# Patient Record
Sex: Male | Born: 1960
Health system: Southern US, Community
[De-identification: ages and names within clinical notes are randomized; demographics above are authoritative.]

## PROBLEM LIST (undated history)

## (undated) DIAGNOSIS — E785 Hyperlipidemia, unspecified: Secondary | ICD-10-CM

## (undated) DIAGNOSIS — F419 Anxiety disorder, unspecified: Secondary | ICD-10-CM

## (undated) DIAGNOSIS — G4733 Obstructive sleep apnea (adult) (pediatric): Secondary | ICD-10-CM

## (undated) DIAGNOSIS — E669 Obesity, unspecified: Secondary | ICD-10-CM

## (undated) HISTORY — DX: Obstructive sleep apnea (adult) (pediatric): G47.33

## (undated) HISTORY — DX: Hyperlipidemia, unspecified: E78.5

## (undated) HISTORY — DX: Anxiety disorder, unspecified: F41.9

## (undated) HISTORY — DX: Obesity, unspecified: E66.9

---

## 2001-06-20 ENCOUNTER — Encounter: Payer: Self-pay | Admitting: Pulmonary Disease

## 2001-07-19 DIAGNOSIS — G4733 Obstructive sleep apnea (adult) (pediatric): Secondary | ICD-10-CM

## 2001-07-19 HISTORY — DX: Obstructive sleep apnea (adult) (pediatric): G47.33

## 2001-07-20 ENCOUNTER — Ambulatory Visit (HOSPITAL_BASED_OUTPATIENT_CLINIC_OR_DEPARTMENT_OTHER): Admission: RE | Admit: 2001-07-20 | Discharge: 2001-07-20 | Payer: Self-pay | Admitting: Pulmonary Disease

## 2001-07-20 ENCOUNTER — Encounter: Payer: Self-pay | Admitting: Pulmonary Disease

## 2001-08-02 ENCOUNTER — Encounter: Payer: Self-pay | Admitting: Pulmonary Disease

## 2001-11-13 ENCOUNTER — Encounter: Payer: Self-pay | Admitting: Pulmonary Disease

## 2004-12-18 ENCOUNTER — Ambulatory Visit: Payer: Self-pay | Admitting: Pulmonary Disease

## 2005-07-23 ENCOUNTER — Emergency Department (HOSPITAL_COMMUNITY): Admission: EM | Admit: 2005-07-23 | Discharge: 2005-07-23 | Payer: Self-pay | Admitting: Emergency Medicine

## 2005-09-09 ENCOUNTER — Ambulatory Visit: Payer: Self-pay | Admitting: Cardiology

## 2005-09-14 ENCOUNTER — Ambulatory Visit: Payer: Self-pay

## 2009-09-01 ENCOUNTER — Encounter: Payer: Self-pay | Admitting: Pulmonary Disease

## 2009-09-04 DIAGNOSIS — G4733 Obstructive sleep apnea (adult) (pediatric): Secondary | ICD-10-CM | POA: Insufficient documentation

## 2009-09-05 ENCOUNTER — Ambulatory Visit: Payer: Self-pay | Admitting: Pulmonary Disease

## 2009-10-09 ENCOUNTER — Encounter: Payer: Self-pay | Admitting: Pulmonary Disease

## 2009-10-20 ENCOUNTER — Encounter: Payer: Self-pay | Admitting: Pulmonary Disease

## 2009-10-21 ENCOUNTER — Encounter: Payer: Self-pay | Admitting: Pulmonary Disease

## 2010-08-18 NOTE — Miscellaneous (Signed)
Summary: Orders Update  Clinical Lists Changes  Orders: Added new Referral order of DME Referral (DME) - Signed 

## 2010-08-18 NOTE — Assessment & Plan Note (Signed)
Summary: re-establish for osa   Primary Provider/Referring Provider:  [facility]  CC:  re-establish for sleep apnea.  History of Present Illness: The pt is a 49y/o male who comes in today to re-establish for management of osa.  He has known mild osa dating back to 2003, and has been on cpap since that time.  I last saw the pt in 2006, and he states that he has been wearing cpap compliantly since that time.  He recently got a new cpap machine and supplies, and is currently using a nasal cpap mask.  He goes to bed at 10pm, and arises at 5am.  He feels that he is sleeping well, and is fairly rested in the am's upon arising.  He denies any significant daytime sleepiness.  He has had a 30 pound weight gain since the last visit.  Preventive Screening-Counseling & Management  Alcohol-Tobacco     Smoking Status: never  Medications Prior to Update: 1)  None  Allergies (verified): No Known Drug Allergies  Past History:  Past Medical History:  OBSTRUCTIVE SLEEP APNEA (ICD-327.23)    Past Surgical History: Reviewed history from 09/04/2009 and no changes required. None per pt report.   Family History: Reviewed history from 09/04/2009 and no changes required. cancer: mother (melanoma), father (prostate)    Social History: Reviewed history from 09/04/2009 and no changes required. Pt is married. pt has 5 children: 2 of his own and 3 step children. Pt lives in [place]. Pt is a Nutritional therapist. Patient never smoked.  Smoking Status:  never  Review of Systems       The patient complains of shortness of breath with activity.  The patient denies shortness of breath at rest, productive cough, non-productive cough, coughing up blood, chest pain, irregular heartbeats, acid heartburn, indigestion, loss of appetite, weight change, abdominal pain, difficulty swallowing, sore throat, tooth/dental problems, headaches, nasal congestion/difficulty breathing through nose,  sneezing, itching, ear ache, anxiety, depression, hand/feet swelling, joint stiffness or pain, rash, change in color of mucus, and fever.    Vital Signs:  Patient profile:   50 year old male Height:      69 inches Weight:      225 pounds BMI:     33.35 O2 Sat:      97 % on Room air Temp:     98.0 degrees F oral Pulse rate:   64 / minute BP sitting:   122 / 84  (left arm) Cuff size:   regular  Vitals Entered By: Arman Filter LPN (September 05, 2009 11:34 AM)  O2 Flow:  Room air CC: re-establish for sleep apnea Comments Medications reviewed with patient Arman Filter LPN  September 05, 2009 11:34 AM    Physical Exam  General:  ow male in nad Eyes:  PERRLA and EOMI.   Nose:  narrowed, with deviated septum to left Mouth:  elongation of soft palate and uvula Neck:  no jvd, tmg, LN Lungs:  clear to auscultation Heart:  rrr, no mrg Extremities:  no edema or cyanosis pulses intact distally Neurologic:  alert and oriented, moves all 4.   Impression & Recommendations:  Problem # 1:  OBSTRUCTIVE SLEEP APNEA (ICD-327.23) the pt has a history of mild osa dating back to 2003, but has not followed up with me in quite some time, and has gained 35 pounds since his original sleep study.  He has recently gotten new equipment, and feels he is doing better.  However, he has not  had his pressure rechecked since he has gained weight.  I think he would benefit from re-optimization of his pressure with an auto device at home.  I have also encouraged him to keep up with equipment maintenance, mask changes, and to work on weight loss.  Other Orders: New Patient Level III ([address]) DME Referral (DME)  Patient Instructions: 1)  will re-optimize your pressure with an auto device at home, and I will let you know your pressure. 2)  work on weight loss 3)  followup with me in 12mos.

## 2010-08-18 NOTE — Letter (Signed)
Summary: External Correspondence  External Correspondence   Imported By: Valinda Hoar 10/21/2009 08:36:18  _____________________________________________________________________  External Attachment:    Type:   Image     Comment:   External Document

## 2010-08-18 NOTE — Consult Note (Signed)
Summary: [facility]   Imported By: Sherian Rein 09/06/2009 08:05:27  _____________________________________________________________________  External Attachment:    Type:   Image     Comment:   External Document

## 2010-08-18 NOTE — Progress Notes (Signed)
Summary: Physician's handwritten notes/[facility]  Physician's handwritten notes/[facility]   Imported By: Sherian Rein 09/06/2009 08:11:01  _____________________________________________________________________  External Attachment:    Type:   Image     Comment:   External Document

## 2010-08-18 NOTE — Letter (Signed)
Summary: CMN for CPAP Supplies DENIED by Physician/[facility]  CMN for CPAP Supplies DENIED by Physician/[facility]   Imported By: Sherian Rein 09/04/2009 09:10:32  _____________________________________________________________________  External Attachment:    Type:   Image     Comment:   External Document

## 2010-08-18 NOTE — Progress Notes (Signed)
Summary: [facility]   Imported By: Sherian Rein 09/06/2009 08:06:18  _____________________________________________________________________  External Attachment:    Type:   Image     Comment:   External Document

## 2010-08-18 NOTE — Letter (Signed)
Summary: CMN for CPAP/[facility]  CMN for CPAP/[facility]   Imported By: Lanelle Bal 10/13/2009 13:04:07  _____________________________________________________________________  External Attachment:    Type:   Image     Comment:   External Document

## 2010-09-04 ENCOUNTER — Ambulatory Visit: Payer: Self-pay | Admitting: Pulmonary Disease

## 2010-09-07 ENCOUNTER — Telehealth: Payer: Self-pay | Admitting: Pulmonary Disease

## 2010-09-15 NOTE — Progress Notes (Signed)
Summary: nos appt  Phone Note Call from Patient   Caller: [email or web address]  Call For: Dailon Sheeran Summary of Call: LMTCB x2 to rsc nos from 2/17. Initial call taken by: Darletta Moll,  September 07, 2010 3:37 PM

## 2011-09-15 ENCOUNTER — Encounter: Payer: Self-pay | Admitting: *Deleted

## 2011-09-16 ENCOUNTER — Ambulatory Visit (INDEPENDENT_AMBULATORY_CARE_PROVIDER_SITE_OTHER): Payer: BC Managed Care – PPO | Admitting: Cardiovascular Disease

## 2011-09-16 ENCOUNTER — Encounter: Payer: Self-pay | Admitting: Cardiovascular Disease

## 2011-09-16 DIAGNOSIS — R079 Chest pain, unspecified: Secondary | ICD-10-CM | POA: Insufficient documentation

## 2011-09-16 DIAGNOSIS — G4733 Obstructive sleep apnea (adult) (pediatric): Secondary | ICD-10-CM

## 2011-09-16 NOTE — Assessment & Plan Note (Signed)
Glen Rivera had an episode of chest discomfort at a time when he was having a mild panic attack. The pain was related to him being anxious about the situation down in [place]. He's not had any further episodes of pain since he has returned to [place].  His overall risk is fairly low. He is a nonsmoker. He does have sleep apnea and is mildly obese. We'll schedule him for a regular stress test which should be useful in risk stratification.  He has had his lipids drawn while he was down in [place]. They'll be sending those labs at his medical doctor and we will try to get a copy of that.  I'll see [name] on as-needed basis following the stress test.

## 2011-09-16 NOTE — Progress Notes (Signed)
    Glen Rivera. Date of Birth  12-16-1960 [facility]  [address]    [address]   [address] [place], [place]  [address]    [place], [place]  [address] [phone]  Fax  [phone]  [phone]  Fax [phone]  Problems  1. Obstructive sleep apnea 2. Panic attacks / chest tightness 3. Gout  History of Present Illness:  Glen Rivera is a 51 yo who presents today at the request of his wife to be checked out after having an anxiety attack and some chest tightness.  He became anxious while in [place] at a fishing tournament.  He has some episodes of chest tightness during that time. When he came back home he has felt well and has not had any further episodes of pain.  He walks on occasion. He has some occasional episodes of shortness of breath he is exerting himself.   No current outpatient prescriptions on file prior to visit.    No Known Allergies  Past Medical History  Diagnosis Date  . OSA (obstructive sleep apnea) 2003    History reviewed. No pertinent past surgical history.  History  Smoking status  . Never Smoker   Smokeless tobacco  . Not on file    History  Alcohol Use: Not on file    History reviewed. No pertinent family history.  Reviw of Systems:  Reviewed in the HPI.  All other systems are negative.  Physical Exam: Blood pressure 139/94, pulse 66, height 5\' 9"  (1.753 m), weight 232 lb 6.4 oz (105.416 kg). General: Well developed, well nourished, in no acute distress.  Head: Normocephalic, atraumatic, sclera non-icteric, mucus membranes are moist,   Neck: Supple. Carotids are 2 + without bruits. No JVD  Lungs: Clear bilaterally to auscultation.  Heart: regular rate  With normal  S1 S2. No murmurs, gallops or rubs.  Abdomen: Soft, non-tender, non-distended with normal bowel sounds. No hepatomegaly. No rebound/guarding. No masses.  Msk:  Strength and tone are normal  Extremities: No clubbing or cyanosis. No  edema.  Distal pedal pulses are 2+ and equal bilaterally.  Neuro: Alert and oriented X 3. Moves all extremities spontaneously.  Psych:  Responds to questions appropriately with a normal affect.  ECG: Normal sinus rhythm. Normal EKG.  Assessment / Plan:

## 2011-09-16 NOTE — Patient Instructions (Signed)
Your physician has requested that you have an exercise tolerance test.  Please also follow instruction sheet, as given.   Your physician recommends that you schedule a follow-up appointment in: will inform after treadmill test.

## 2011-09-20 ENCOUNTER — Encounter: Payer: Self-pay | Admitting: Pulmonary Disease

## 2011-09-20 ENCOUNTER — Ambulatory Visit (INDEPENDENT_AMBULATORY_CARE_PROVIDER_SITE_OTHER): Payer: BC Managed Care – PPO | Admitting: Pulmonary Disease

## 2011-09-20 VITALS — BP 122/76 | HR 67 | Temp 98.4°F | Ht 69.0 in | Wt 237.6 lb

## 2011-09-20 DIAGNOSIS — G4733 Obstructive sleep apnea (adult) (pediatric): Secondary | ICD-10-CM

## 2011-09-20 NOTE — Assessment & Plan Note (Signed)
The patient has a history of mild obstructive sleep apnea that has responded in the past to CPAP.  Currently, he is not sleeping as well and see head, and does not feel as rested.  He is also getting a lot of sleepiness in the evening with periods of inactivity.  His weight has increased 12#the last visit, and I suspect his pressure is not correct at this time.  He got a new CPAP machine last summer and has a new mask currently.  Will optimize his pressure again with an AutoSet device, and call him with results.  I have encouraged him to work aggressively on weight loss.

## 2011-09-20 NOTE — Patient Instructions (Signed)
Will recheck your pressure on an auto device to find optimal pressure.  Will call you with results.  Work on weight loss If doing well, followup with me in one year.

## 2011-09-20 NOTE — Progress Notes (Signed)
  Subjective:    Patient ID: Glen Johns., male    DOB: 1960-12-01, 51 y.o.   MRN: [id number]  HPI The patient comes in today for followup of his known obstructive sleep apnea.  He has been wearing CPAP compliantly, but feels that he is not sleeping as well and has increased daytime sleepiness.  He has a new CPAP machine from the summer, and recently got a new mask.  His weight has increased 12 pounds from his last visit, and 47 pounds from his initial sleep study in 2003.   Review of Systems  Constitutional: Negative for fever and unexpected weight change.  HENT: Negative for ear pain, nosebleeds, congestion, sore throat, rhinorrhea, sneezing, trouble swallowing, dental problem, postnasal drip and sinus pressure.   Eyes: Negative for redness and itching.  Respiratory: Negative for cough, chest tightness, shortness of breath and wheezing.   Cardiovascular: Negative for palpitations and leg swelling.  Gastrointestinal: Negative for nausea and vomiting.  Genitourinary: Negative for dysuria.  Musculoskeletal: Negative for joint swelling.  Skin: Negative for rash.  Neurological: Negative for headaches.  Hematological: Does not bruise/bleed easily.  Psychiatric/Behavioral: Negative for dysphoric mood. The patient is nervous/anxious.        Objective:   Physical Exam Overweight male in no acute distress No skin breakdown or pressure necrosis from the CPAP mask Lower extremities with mild edema, no cyanosis Alert and oriented, moves all 4 extremities.       Assessment & Plan:

## 2011-10-06 ENCOUNTER — Encounter: Payer: Self-pay | Admitting: Nurse Practitioner

## 2011-10-06 ENCOUNTER — Ambulatory Visit (INDEPENDENT_AMBULATORY_CARE_PROVIDER_SITE_OTHER): Payer: BC Managed Care – PPO | Admitting: Nurse Practitioner

## 2011-10-06 DIAGNOSIS — R079 Chest pain, unspecified: Secondary | ICD-10-CM

## 2011-10-06 NOTE — Progress Notes (Signed)
Exercise Treadmill Test  Pre-Exercise Testing Evaluation Rhythm: normal sinus  Rate: 63   PR:  .18 QRS:  .09  QT:  .39 QTc: .40     Test  Exercise Tolerance Test Ordering MD: Leodis Sias , MD  Interpreting MD:  Norma Fredrickson NP  Unique Test No: 1  Treadmill:  1  Indication for ETT: chest pain - rule out ischemia  Contraindication to ETT: No   Stress Modality: exercise - treadmill  Cardiac Imaging Performed: non   Protocol: standard Bruce - maximal  Max BP:  179/77  Max MPHR (bpm):  167 85% MPR (bpm):  141  MPHR obtained (bpm):  166 % MPHR obtained:  98%  Reached 85% MPHR (min:sec): 6:20 Total Exercise Time (min-sec):  8:00  Workload in METS:  10.1 Borg Scale: 20  Reason ETT Terminated:  patient's desire to stop    ST Segment Analysis At Rest: normal ST segments - no evidence of significant ST depression With Exercise: no evidence of significant ST depression  Other Information Arrhythmia:  No Angina during ETT:  absent (0) Quality of ETT:  diagnostic  ETT Interpretation:  normal - no evidence of ischemia by ST analysis  Comments: Patient underwent GXT for atypical chest pain. Exercised on the standard Bruce protocol for a total of 8 minutes. Fair exercise tolerance. Adequate blood pressure response. Clinically negative for chest pain. Test stopped due to fatigue. EKG negative. Tracings reviewed with Dr. Elease Hashimoto  Recommendations: A regular exercise program is recommended. Dr. Elease Hashimoto will be happy to see you back as needed or yearly for a repeat GXT. Please call for any problems or concerns. Patient is agreeable to this plan and will call if any problems develop in the interim.

## 2011-10-06 NOTE — Patient Instructions (Signed)
Walking every day is encouraged.  We will see you back as needed or yearly for a repeat treadmill  Call the [facility] office at [phone] if you have any questions, problems or concerns.

## 2011-11-07 ENCOUNTER — Other Ambulatory Visit: Payer: Self-pay | Admitting: Pulmonary Disease

## 2011-11-07 DIAGNOSIS — G4733 Obstructive sleep apnea (adult) (pediatric): Secondary | ICD-10-CM

## 2012-09-19 ENCOUNTER — Ambulatory Visit: Payer: BC Managed Care – PPO | Admitting: Pulmonary Disease

## 2012-10-04 ENCOUNTER — Ambulatory Visit: Payer: BC Managed Care – PPO | Admitting: Pulmonary Disease

## 2012-11-13 ENCOUNTER — Encounter: Payer: Self-pay | Admitting: Pulmonary Disease

## 2012-11-13 ENCOUNTER — Ambulatory Visit (INDEPENDENT_AMBULATORY_CARE_PROVIDER_SITE_OTHER): Payer: BC Managed Care – PPO | Admitting: Pulmonary Disease

## 2012-11-13 VITALS — BP 102/80 | HR 64 | Temp 97.0°F | Ht 69.25 in | Wt 235.6 lb

## 2012-11-13 DIAGNOSIS — G4733 Obstructive sleep apnea (adult) (pediatric): Secondary | ICD-10-CM

## 2012-11-13 NOTE — Assessment & Plan Note (Signed)
The patient appears to be tolerating CPAP quite well, and is satisfied with his symptom control.  I have encouraged him to work aggressively on weight loss, and also to keep up with his CPAP supplies.  He will followup with me in one year if doing well.

## 2012-11-13 NOTE — Patient Instructions (Addendum)
Continue on cpap, and keep up with mask changes and supplies. Work on weight loss followup with me in one year.  

## 2012-11-13 NOTE — Progress Notes (Signed)
  Subjective:    Patient ID: Glen Rivera., male    DOB: [birth year], 52 y.o.   MRN: [id number]  HPI The patient comes in today for followup of his obstructive sleep apnea.  He is wearing CPAP compliant, and is having significant issues with his mask fit or pressure.  He feels that he sleeps well, and is satisfied with his daytime alertness.  His weight is down 2 pounds from last visit a year ago.   Review of Systems  Constitutional: Negative for fever and unexpected weight change.  HENT: Negative for ear pain, nosebleeds, congestion, sore throat, rhinorrhea, sneezing, trouble swallowing, dental problem, postnasal drip and sinus pressure.   Eyes: Negative for redness and itching.  Respiratory: Negative for cough, chest tightness, shortness of breath and wheezing.   Cardiovascular: Negative for palpitations and leg swelling.  Gastrointestinal: Negative for nausea and vomiting.  Genitourinary: Negative for dysuria.  Musculoskeletal: Negative for joint swelling.  Skin: Negative for rash.  Neurological: Negative for headaches.  Hematological: Does not bruise/bleed easily.  Psychiatric/Behavioral: Negative for dysphoric mood. The patient is not nervous/anxious.        Objective:   Physical Exam Overweight male in nad Nose without purulence or d/c noted. No skin breakdown or pressure necrosis from cpap mask. Neck without LN or tmg. LE without edema, no cyanosis.  Alert ,does not appear sleepy, moves all 4.        Assessment & Plan:

## 2013-11-13 ENCOUNTER — Encounter: Payer: Self-pay | Admitting: Pulmonary Disease

## 2013-11-13 ENCOUNTER — Ambulatory Visit (INDEPENDENT_AMBULATORY_CARE_PROVIDER_SITE_OTHER): Payer: BC Managed Care – PPO | Admitting: Pulmonary Disease

## 2013-11-13 VITALS — BP 118/80 | HR 63 | Temp 96.8°F | Ht 69.0 in | Wt 218.8 lb

## 2013-11-13 DIAGNOSIS — G4733 Obstructive sleep apnea (adult) (pediatric): Secondary | ICD-10-CM

## 2013-11-13 NOTE — Progress Notes (Signed)
   Subjective:    Patient ID: Glen JohnsJames Lochridge Jr., male    DOB: 1960/11/22, 53 y.o.   MRN: [id number]  HPI Patient comes in today for followup of his obstructive sleep apnea. He is wearing CPAP compliantly, and is having no issues with his mask fit or pressure. He feels that he is sleeping very well, and is satisfied with his daytime alertness. He has lost 17 pounds since last visit.   Review of Systems  Constitutional: Negative for fever and unexpected weight change.  HENT: Positive for postnasal drip. Negative for congestion, dental problem, ear pain, nosebleeds, rhinorrhea, sinus pressure, sneezing, sore throat and trouble swallowing.   Eyes: Negative for redness and itching.  Respiratory: Negative for cough, chest tightness, shortness of breath and wheezing.   Cardiovascular: Negative for palpitations and leg swelling.  Gastrointestinal: Negative for nausea and vomiting.  Genitourinary: Negative for dysuria.  Musculoskeletal: Negative for joint swelling.  Skin: Negative for rash.  Neurological: Negative for headaches.  Hematological: Does not bruise/bleed easily.  Psychiatric/Behavioral: Negative for dysphoric mood. The patient is not nervous/anxious.        Objective:   Physical Exam Overweight male in no acute distress Nose without purulence or discharge noted No skin breakdown or pressure necrosis from the CPAP mask Neck without lymphadenopathy or thyromegaly Lower extremities without edema, no cyanosis Alert and oriented, moves all 4 extremities.       Assessment & Plan:

## 2013-11-13 NOTE — Patient Instructions (Signed)
Continue to work on weight loss, you are doing great. Stay on cpap, and keep up with the mask changes and supplies. followup with me again in one year.

## 2013-11-13 NOTE — Assessment & Plan Note (Signed)
The patient is doing very well with his CPAP, and feels that he is satisfied with his sleep and daytime alertness. He has lost 17 pounds since his last visit, and is continuing to work on further weight loss. I've asked him to keep up with his mask changes and supplies, and to followup with me again in one year.

## 2014-11-12 ENCOUNTER — Ambulatory Visit: Payer: BLUE CROSS/BLUE SHIELD | Admitting: Pulmonary Disease

## 2014-11-26 ENCOUNTER — Ambulatory Visit (INDEPENDENT_AMBULATORY_CARE_PROVIDER_SITE_OTHER): Payer: BLUE CROSS/BLUE SHIELD | Admitting: Pulmonary Disease

## 2014-11-26 ENCOUNTER — Encounter: Payer: Self-pay | Admitting: Pulmonary Disease

## 2014-11-26 VITALS — BP 122/74 | HR 60 | Temp 97.4°F | Ht 68.0 in | Wt 221.4 lb

## 2014-11-26 DIAGNOSIS — G4733 Obstructive sleep apnea (adult) (pediatric): Secondary | ICD-10-CM | POA: Diagnosis not present

## 2014-11-26 NOTE — Progress Notes (Signed)
   Subjective:    Patient ID: Glen JohnsJames Hundal Jr., male    DOB: Jan 24, 1961, 54 y.o.   MRN: [id number]  HPI The patient comes in today for follow-up of his obstructive sleep apnea. He is wearing C Pap compliantly, and is having no issues with his pressure. He is having issues with mask fit, but has not changed his mask cushion in a few months. He is satisfied with his sleep and daytime alertness. Of note, his weight is up a few pounds from the last visit.   Review of Systems  Constitutional: Negative for fever and unexpected weight change.  HENT: Negative for congestion, dental problem, ear pain, nosebleeds, postnasal drip, rhinorrhea, sinus pressure, sneezing, sore throat and trouble swallowing.   Eyes: Negative for redness and itching.  Respiratory: Negative for cough, chest tightness, shortness of breath and wheezing.   Cardiovascular: Negative for palpitations and leg swelling.  Gastrointestinal: Negative for nausea and vomiting.  Genitourinary: Negative for dysuria.  Musculoskeletal: Negative for joint swelling.  Skin: Negative for rash.  Neurological: Negative for headaches.  Hematological: Does not bruise/bleed easily.  Psychiatric/Behavioral: Negative for dysphoric mood. The patient is not nervous/anxious.        Objective:   Physical Exam Overweight male in no acute distress Nose without purulence or discharge noted No skin breakdown or pressure necrosis from the C Pap mask Neck without lymphadenopathy or thyromegaly Lower extremities without edema, no cyanosis Alert and oriented, moves all 4 extremities.       Assessment & Plan:

## 2014-11-26 NOTE — Assessment & Plan Note (Signed)
The patient feels that he continues to do well with his C Pap device, and has no issues with his sleep or daytime alertness. He is having some mask fit issues, but he has not changed the cushion on his mask in 2-3 months. I've asked him to do this, and if he continues to have fit issues, I would get him to the sleep Center for a formal fitting. I've also encouraged him to work aggressively on weight loss.

## 2014-11-26 NOTE — Patient Instructions (Signed)
Continue on cpap, and keep up with mask changes and supplies. Keep working on weight loss If you continue to have mask fit issues after replacing your mask cushion, let us know and we can arrange for fitting session at the sleep center.  followup with Dr. Craige CottaSood in one year.

## 2015-02-14 ENCOUNTER — Telehealth: Payer: Self-pay | Admitting: Pulmonary Disease

## 2015-02-14 DIAGNOSIS — G4733 Obstructive sleep apnea (adult) (pediatric): Secondary | ICD-10-CM

## 2015-02-14 NOTE — Telephone Encounter (Signed)
Pt aware of recs.  Order placed.  Nothing further needed at this time.

## 2015-02-14 NOTE — Telephone Encounter (Signed)
Spoke with the pt  [place] working  He states needs order for CPAP with settings faxed to [facility] at the number given in original msg  Per KC's last note in May 2016 the pt is to f/u with [facility] is on vacation today so will forward to CDY for approval since he is the only sleep doc here today Thanks

## 2015-02-14 NOTE — Telephone Encounter (Signed)
Ok to send Glen Rivera order as requested for CPAP 8, mask of choice, humidifier, supplies    Dx OSA             They will also need a copy of his sleep study physician interpretation

## 2015-12-30 ENCOUNTER — Ambulatory Visit (INDEPENDENT_AMBULATORY_CARE_PROVIDER_SITE_OTHER): Payer: BLUE CROSS/BLUE SHIELD | Admitting: Pulmonary Disease

## 2015-12-30 ENCOUNTER — Encounter: Payer: Self-pay | Admitting: Pulmonary Disease

## 2015-12-30 VITALS — BP 122/82 | HR 64 | Ht 68.0 in | Wt 208.6 lb

## 2015-12-30 DIAGNOSIS — G4733 Obstructive sleep apnea (adult) (pediatric): Secondary | ICD-10-CM | POA: Diagnosis not present

## 2015-12-30 NOTE — Patient Instructions (Signed)
Bring your CPAP in to try getting report from machine when it is convenient for you  Follow up in 1 year

## 2015-12-30 NOTE — Progress Notes (Signed)
Current Outpatient Prescriptions on File Prior to Visit  Medication Sig  . atorvastatin (LIPITOR) 20 MG tablet Take 1 tablet by mouth daily.  . clonazePAM (KLONOPIN) 0.5 MG tablet Take 1 tablet by mouth as needed.  Marland Kitchen. VIAGRA 50 MG tablet Take 1 tablet by mouth as needed.   No current facility-administered medications on file prior to visit.     Chief Complaint  Patient presents with  . Follow-up    Wears CPAP nightly. Denies problems with mask/pressure. Pt reports buying a new machine last year while on vacation and is not sure that there is a card in it. Not sure if he has a DME. [facility] but does not use them regularly.      Tests PSH 07/20/01 >> [facility] 11  Past medical hx Anxiety, HLD  Past surgical hx, Allergies, Family hx, Social hx all reviewed.  Vital Signs BP 122/82 mmHg  Pulse 64  Ht 5\' 8"  (1.727 m)  Wt 208 lb 9.6 oz (94.62 kg)  BMI 31.72 kg/m2  SpO2 98%  History of Present Illness Glen Rivera is a 55 y.o. male with obstructive sleep apnea.  He buys his CPAP supplies on his own.  He has a high deductible with his insurance.  He sleeps with CPAP every night.  He feels this has helped, and he no longer has trouble feeling sleepy during the day.  Physical Exam  General - No distress ENT - No sinus tenderness, no oral exudate, no LAN, MP 4, enlarged tongue Cardiac - s1s2 regular, no murmur Chest - No wheeze/rales/dullness Back - No focal tenderness Abd - Soft, non-tender Ext - No edema Neuro - Normal strength Skin - No rashes Psych - normal mood, and behavior   Assessment/Plan  Obstructive sleep apnea. - asked him to bring his machine in to try getting download   Patient Instructions  Bring your CPAP in to try getting report from machine when it is convenient for you  Follow up in 1 year     Coralyn HellingVineet Amra Shukla, MD [facility] Pager:  [phone] 12/30/2015, 10:19 AM

## 2016-01-23 DIAGNOSIS — K642 Third degree hemorrhoids: Secondary | ICD-10-CM | POA: Diagnosis not present

## 2016-07-02 ENCOUNTER — Ambulatory Visit (INDEPENDENT_AMBULATORY_CARE_PROVIDER_SITE_OTHER): Payer: BLUE CROSS/BLUE SHIELD | Admitting: Sports Medicine

## 2016-07-02 ENCOUNTER — Encounter (INDEPENDENT_AMBULATORY_CARE_PROVIDER_SITE_OTHER): Payer: Self-pay | Admitting: Sports Medicine

## 2016-07-02 ENCOUNTER — Encounter (INDEPENDENT_AMBULATORY_CARE_PROVIDER_SITE_OTHER): Payer: Self-pay

## 2016-07-02 VITALS — BP 136/91 | HR 64 | Ht 69.0 in | Wt 210.0 lb

## 2016-07-02 DIAGNOSIS — M7542 Impingement syndrome of left shoulder: Secondary | ICD-10-CM | POA: Diagnosis not present

## 2016-07-02 DIAGNOSIS — G8929 Other chronic pain: Secondary | ICD-10-CM | POA: Diagnosis not present

## 2016-07-02 DIAGNOSIS — M25512 Pain in left shoulder: Secondary | ICD-10-CM | POA: Diagnosis not present

## 2016-07-02 MED ORDER — METHYLPREDNISOLONE ACETATE 40 MG/ML IJ SUSP
40.0000 mg | INTRAMUSCULAR | Status: AC | PRN
  Administered 2016-07-02: 40 mg via INTRA_ARTICULAR

## 2016-07-02 MED ORDER — LIDOCAINE HCL 1 % IJ SOLN
3.0000 mL | INTRAMUSCULAR | Status: AC | PRN
  Administered 2016-07-02: 3 mL

## 2016-07-02 NOTE — Progress Notes (Signed)
Glen Rivera - 55 y.o. male MRN [id number]  Date of birth: 1961/02/25  Office Visit Note: Visit Date: 07/02/2016 PCP: Farris HasMORROW, AARON, MD Referred by: Farris HasMorrow, Aaron, MD  Subjective: Chief Complaint  Patient presents with  . Left Shoulder - Follow-up  . Follow-up    Patient states left shoulder hurts and throbs.  Hurts to raise left arm above his head.  Has had an injection before and injection did help.   HPI: 2 months of worsening left shoulder pain & weakness. He is previously undergone a subacromial injection with almost 100% improvement for several months. He denies any single significant injury & has pain mainly with use of the supraspinatus. No issues with above head motion once he is they're going from 70 to 120 of motion is most painful. Ibuprofen or Aleve been tried intermittently without significant improvement.  Of note he was seen by myself on the SIRS system & his chart was not brought over during the transition process. X-rays were obtained & were pulled from archive. These were reviewed today.   ROS: Denies any fevers chills recently gain or weight loss. Some pain at night but does not typically keep him awake. No interference with day-to-day activities or at work.. Otherwise per HPI.   Clinical History: No specialty comments available.  He reports that he has never smoked. He does not have any smokeless tobacco history on file.  No results for input(s): HGBA1C, LABURIC in the last 8760 hours.  Assessment & Plan: Visit Diagnoses: No diagnosis found.  Plan: Left subacromial injection today. We emphasized importance of continuing with therapeutic exercises. If any persistent ongoing severe pain or persistent weakness & follow-up will need further advanced imaging with MRI for consideration of arthroscopic debridement versus rotator cuff repairs & do suspect he has a-arsenal thickness tear minimum. If this is a rotator cuff tear & is chronic in nature & does not require  urgent surgical intervention.  Follow-up: No Follow-up on file. I am happy to follow up with this patient at my new location [name] for their chronic ongoing issues.   Meds: No orders of the defined types were placed in this encounter.  Procedures: LEFT SHOULDER INJECTION - Subacromial Date/Time: 07/02/2016 9:30 AM Performed by: Gaspar BiddingIGBY, MICHAEL D Authorized by: Gaspar BiddingIGBY, MICHAEL D   Consent Given by:  Patient Site marked: the procedure site was marked   Timeout: prior to procedure the correct patient, procedure, and site was verified   Indications:  Pain Location:  Shoulder Site:  L subacromial bursa Prep: patient was prepped and draped in usual sterile fashion   Needle Size:  22 G Needle Length:  1.5 inches Approach:  Posterior Ultrasound Guidance: No   Fluoroscopic Guidance: No   Arthrogram: No   Medications:  3 mL lidocaine 1 %; 40 mg methylPREDNISolone acetate 40 MG/ML     Objective:  VS:  HT:5\' 9"  (175.3 cm)   WT:210 lb (95.3 kg)  BMI:31.1    BP:(!) 136/91  HR:64bpm  TEMP: ( )  RESP:  Physical Exam:  Adult male. Alert and appropriate.  In no acute distress.  Upper extremities are overall well aligned with no significant deformity. No significant swelling.  Distal pulses 2+/4. No significant bruising/ecchymosis or erythema the skin Left shoulder: Normal appearing. Pain with Hawkins & Neer's but most focally with empty can testing. He is grossly weak with empty can testing but otherwise his strength is intact internal rotation, external rotation  speeds testing & Yergason's testing. No pain with axial loading & circumduction. Small amount of pain over the [facility] on the left. Imaging: No results found.  Prior x-rays of the shoulder were reviewed from the [facility].  Small amount of AC Joint arthropathy otherwise normal x-rays.  Past Medical/Family/Surgical/Social History: Medications & Allergies reviewed per  EMR Patient Active Problem List   Diagnosis Date Noted  . Chest pain 09/16/2011  . Obstructive sleep apnea 09/04/2009   Past Medical History:  Diagnosis Date  . Anxiety   . Hyperlipidemia   . Obesity   . OSA (obstructive sleep apnea) 2003   No family history on file. No past surgical history on file. Social History   Occupational History  . Not on file.   Social History Main Topics  . Smoking status: Never Smoker  . Smokeless tobacco: Not on file  . Alcohol use No  . Drug use: No  . Sexual activity: Yes

## 2016-07-02 NOTE — Patient Instructions (Signed)
I am transferring practices as of January 1st  to [facility] at [facility].  This is a great opportunity & I am saddened to be leaving [facility] however & excited for new opportunities. I will continue to be seeing patients at [facility] through the end of December. I am happy to see you at the new location but also am confident that you are in great hands with the excellent providers here at [facility].  We are not currently scheduling patients at the new location at this time but if you look on [facility]'s website a contact information should be available there closer to January. Additionally [email or web address] will have information when it becomes available.    The telephone number will be [phone]  - Nobody will be answering this phone number until closer to January. 

## 2016-07-16 ENCOUNTER — Telehealth: Payer: Self-pay | Admitting: Pulmonary Disease

## 2016-07-16 NOTE — Telephone Encounter (Signed)
Download taken off card and placed in VS' look-at.  Called pt, aware of this.  Nothing further needed.

## 2017-04-05 DIAGNOSIS — H5203 Hypermetropia, bilateral: Secondary | ICD-10-CM | POA: Diagnosis not present

## 2017-11-22 DIAGNOSIS — Z0000 Encounter for general adult medical examination without abnormal findings: Secondary | ICD-10-CM | POA: Diagnosis not present

## 2017-11-22 DIAGNOSIS — D649 Anemia, unspecified: Secondary | ICD-10-CM | POA: Diagnosis not present

## 2017-11-22 DIAGNOSIS — D539 Nutritional anemia, unspecified: Secondary | ICD-10-CM | POA: Diagnosis not present

## 2017-11-22 DIAGNOSIS — Z125 Encounter for screening for malignant neoplasm of prostate: Secondary | ICD-10-CM | POA: Diagnosis not present

## 2017-11-22 DIAGNOSIS — E785 Hyperlipidemia, unspecified: Secondary | ICD-10-CM | POA: Diagnosis not present

## 2017-11-22 DIAGNOSIS — Z23 Encounter for immunization: Secondary | ICD-10-CM | POA: Diagnosis not present

## 2017-12-05 ENCOUNTER — Ambulatory Visit (INDEPENDENT_AMBULATORY_CARE_PROVIDER_SITE_OTHER): Payer: BLUE CROSS/BLUE SHIELD | Admitting: Sports Medicine

## 2017-12-05 ENCOUNTER — Encounter: Payer: Self-pay | Admitting: Sports Medicine

## 2017-12-05 ENCOUNTER — Ambulatory Visit: Payer: Self-pay

## 2017-12-05 VITALS — BP 140/88 | HR 62 | Ht 69.0 in | Wt 228.8 lb

## 2017-12-05 DIAGNOSIS — M75101 Unspecified rotator cuff tear or rupture of right shoulder, not specified as traumatic: Secondary | ICD-10-CM

## 2017-12-05 DIAGNOSIS — G2589 Other specified extrapyramidal and movement disorders: Secondary | ICD-10-CM | POA: Diagnosis not present

## 2017-12-05 DIAGNOSIS — M25511 Pain in right shoulder: Secondary | ICD-10-CM

## 2017-12-05 NOTE — Progress Notes (Signed)
Glen Rivera. [facility] at [facility] [phone]  Glen Rivera - 57 y.o. male MRN [id number]  Date of birth: [birth year]  Visit Date: 12/05/2017  PCP: Glen Has, MD   Referred by: Glen Has, MD  Scribe for today's visit: Glen Rivera, CMA     SUBJECTIVE:  Glen Rivera is here for R shoulder pain  His R shoulder pain symptoms INITIALLY: Began about 6 months ago and MOI is unknown. No past injuries to the R shoulder. Described as moderate stabbing and aching, radiating to the R arm but not into the hand.  Worsened with lifting objects, reaching up.  Improved with rest, but pain is still present.  Additional associated symptoms include: Pain is anterior. He Rivera noticed weakness in the R arm.     At this time symptoms show no change compared to onset. Pain if off and on and varies is severity day to day. He Rivera been taking Aleve or Tylenol with minimal relief.   ROS Denies night time disturbances. Denies fevers, chills, or night sweats. Denies unexplained weight loss. Denies personal history of cancer. Denies changes in bowel or bladder habits. Denies recent unreported falls. Denies new or worsening dyspnea or wheezing. Denies headaches or dizziness.  Reports weakness in the extremities.  Denies dizziness or presyncopal episodes Denies lower extremity edema    HISTORY & PERTINENT PRIOR DATA:  Prior History reviewed and updated per electronic medical record.  Significant/pertinent history, findings, studies include:  reports that he Rivera never smoked. He Rivera never used smokeless tobacco. No results for input(s): HGBA1C, LABURIC, CREATINE in the last 8760 hours. No specialty comments available. No problems updated.  OBJECTIVE:  VS:  HT:5\' 9"  (175.3 cm)   WT:228 lb 12.8 oz (103.8 kg)  BMI:33.77    BP:140/88  HR:62bpm  TEMP: ( )  RESP:95 %   PHYSICAL EXAM: Constitutional: WDWN, Non-toxic  appearing. Psychiatric: Alert & appropriately interactive.  Not depressed or anxious appearing. Respiratory: No increased work of breathing.  Trachea Midline Eyes: Pupils are equal.  EOM intact without nystagmus.  No scleral icterus  Vascular Exam: warm to touch no edema  lower extremity neuro exam: unremarkable normal strength normal sensation  MSK Exam: Right shoulder held in slight protraction.  Full overhead range of motion with pain with terminal flexion and abduction.  Internal rotation and external rotation strength is intact however he does have pain that limits his external rotation.  No pain with axial loading circumduction.  Negative drop arm test.  Glen Rivera is mildly painful terminally and Neer's is as well but this is only mild.  Strength and pain is normal with empty can testing, speeds testing, O'Brien's testing.   ASSESSMENT & PLAN:   1. Right shoulder pain, unspecified chronicity     PLAN: Right subacromial injection performed today.  We will follow-up with him in 6 weeks to ensure clinical resolution.  If any persistent ongoing symptoms further diagnostic evaluation with MSK ultrasound will be pursued.  Discussed the foundation of treatment for this condition is physical therapy and/or daily (5-6 days/week) therapeutic exercises, focusing on core strengthening, coordination, neuromuscular control/reeducation.  Therapeutic exercises prescribed per procedure note.  Follow-up: Return in about 6 weeks (around 01/16/2018).      Please see additional documentation for Objective, Assessment and Plan sections. Pertinent additional documentation may be included in corresponding procedure notes, imaging studies, problem based documentation and patient instructions. Please see these sections of the encounter  for additional information regarding this visit.  CMA/ATC served as Neurosurgeon during this visit. History, Physical, and Plan performed by medical provider. Documentation and  orders reviewed and attested to.      Andrena Mews, DO    [facility] Physician

## 2017-12-05 NOTE — Progress Notes (Signed)

## 2017-12-05 NOTE — Progress Notes (Signed)
PROCEDURE NOTE : Right subacromial shoulder Injection After discussing the risks, benefits and expected outcomes of the injection and all questions were reviewed and answered,  he wished to undergo the above named procedure.  Written consent was obtained. After an appropriate time out was taken the target structure prepped and injected as below: Prep:  Betadine and alcohol,  Approach:  posterior  Anesthes  Ethel chloride,   Needle:  22g  Aspirate:  N/a  Meds:  2cc 0.5% marcaine, 2cc /mL depo-Medrol  Dressing:  Bandaid  This procedure was well tolerated and there were no complications.

## 2017-12-05 NOTE — Patient Instructions (Addendum)

## 2018-01-02 ENCOUNTER — Ambulatory Visit (INDEPENDENT_AMBULATORY_CARE_PROVIDER_SITE_OTHER): Payer: BLUE CROSS/BLUE SHIELD | Admitting: Pulmonary Disease

## 2018-01-02 ENCOUNTER — Encounter: Payer: Self-pay | Admitting: Pulmonary Disease

## 2018-01-02 VITALS — BP 118/78 | HR 69 | Ht 69.0 in | Wt 225.0 lb

## 2018-01-02 DIAGNOSIS — G4733 Obstructive sleep apnea (adult) (pediatric): Secondary | ICD-10-CM | POA: Diagnosis not present

## 2018-01-02 NOTE — Progress Notes (Signed)
[facility]  Chief Complaint  Patient presents with  . Follow-up    Pt still having daytime sleepiness, and pt feels the pressure should be higher or retested.    Vital signs: BP 118/78 (BP Location: Left Arm, Cuff Size: Normal)   Pulse 69   Ht 5\' 9"  (1.753 m)   Wt 225 lb (102.1 kg)   SpO2 97%   BMI 33.23 kg/m   History of Present Illness: Glen Rivera is a 57 y.o. male obstructive sleep apnea.  He has been doing well with CPAP.  No issues with mask fit.  Has old machine [place].  He only gets bout 6.5 hrs sleep per night.  Feels tired during the day.  Feels better if he can sleep longer.  Physical Exam:  General - pleasant Eyes - pupils reactive ENT - no sinus tenderness, no oral exudate, no LAN, MP 4 Cardiac - regular, no murmur Chest - no wheeze, rales Abd - soft, non tender Ext - no edema Skin - no rashes Neuro - normal strength Psych - normal mood   Assessment/Plan:  Obstructive sleep apnea. - he is compliant with CPAP and reports benefit - will change to auto CPAP 8 to 16 cm H2O - advised him to allow more time for sleep - he will check if he is eligible for a new machine   Patient Instructions  Check with your home care company and insurance about whether you are eligible for a new CPAP machine  Follow up in 1 year    Coralyn HellingVineet Fidela Cieslak, MD [facility] 01/02/2018, 4:07 PM  Flow Sheet  [facility] AHI 11 Auto CPAP 12/03/17 to 01/01/18 >> used on 28 of 30 nights with average 6 hrs 26 min.  Average AHI 3.2 with median CPAP 9 and 95 th percentile CPAP 11 cm H2O  Past Medical History: He  has a past medical history of Anxiety, Hyperlipidemia, Obesity, and OSA (obstructive sleep apnea) (2003).  Past Surgical History: He has no prior surgeries  Family History: He denies family history of sleep apnea.  Social History: He  reports that he has never smoked. He has never used  smokeless tobacco. He reports that he does not drink alcohol or use drugs.  Medications: Allergies as of 01/02/2018   No Known Allergies     Medication List        Accurate as of 01/02/18  4:07 PM. Always use your most recent med list.          atorvastatin 20 MG tablet Commonly known as:  LIPITOR Take 1 tablet by mouth daily.   clonazePAM 0.5 MG tablet Commonly known as:  KLONOPIN Take 1 tablet by mouth as needed.   indomethacin 50 MG capsule Commonly known as:  INDOCIN TAKE ONE CAPSULE TWICE A DAY WITH FOOD OR MILK FOR 5 DAYS   VIAGRA 50 MG tablet Generic drug:  sildenafil Take 1 tablet by mouth as needed.

## 2018-01-02 NOTE — Patient Instructions (Signed)
Check with your home care company and insurance about whether you are eligible for a new CPAP machine  Follow up in 1 year

## 2018-01-16 ENCOUNTER — Ambulatory Visit: Payer: BLUE CROSS/BLUE SHIELD | Admitting: Sports Medicine

## 2018-01-24 ENCOUNTER — Emergency Department (HOSPITAL_COMMUNITY): Payer: BLUE CROSS/BLUE SHIELD

## 2018-01-24 ENCOUNTER — Other Ambulatory Visit: Payer: Self-pay

## 2018-01-24 ENCOUNTER — Encounter (HOSPITAL_COMMUNITY): Payer: Self-pay | Admitting: Emergency Medicine

## 2018-01-24 ENCOUNTER — Emergency Department (HOSPITAL_COMMUNITY)
Admission: EM | Admit: 2018-01-24 | Discharge: 2018-01-24 | Disposition: A | Discharge status: Home / Self Care | Payer: BLUE CROSS/BLUE SHIELD | Attending: Emergency Medicine | Admitting: Emergency Medicine

## 2018-01-24 DIAGNOSIS — Z79899 Other long term (current) drug therapy: Secondary | ICD-10-CM | POA: Diagnosis not present

## 2018-01-24 DIAGNOSIS — R0789 Other chest pain: Secondary | ICD-10-CM | POA: Diagnosis not present

## 2018-01-24 DIAGNOSIS — R079 Chest pain, unspecified: Secondary | ICD-10-CM | POA: Diagnosis not present

## 2018-01-24 LAB — BASIC METABOLIC PANEL
ANION GAP: 11 (ref 5–15)
BUN: 25 mg/dL — AB (ref 6–20)
CALCIUM: 9.9 mg/dL (ref 8.9–10.3)
CHLORIDE: 103 mmol/L (ref 98–111)
CO2: 25 mmol/L (ref 22–32)
CREATININE: 1.02 mg/dL (ref 0.61–1.24)
GFR calc Af Amer: >60 mL/min (ref >60)
GFR calc non Af Amer: >60 mL/min (ref >60)
Glucose, Bld: 101 mg/dL — ABNORMAL HIGH (ref 70–99)
Potassium: 4 mmol/L (ref 3.5–5.1)
Sodium: 139 mmol/L (ref 135–145)

## 2018-01-24 LAB — I-STAT TROPONIN, ED
TROPONIN I, POC: 0 ng/mL (ref 0.00–0.08)
TROPONIN I, POC: 0 ng/mL (ref 0.00–0.08)

## 2018-01-24 LAB — CBC
HCT: 39 % (ref 39.0–52.0)
Hemoglobin: 12.3 g/dL — ABNORMAL LOW (ref 13.0–17.0)
MCH: 30 pg (ref 26.0–34.0)
MCHC: 31.5 g/dL (ref 30.0–36.0)
MCV: 95.1 fL (ref 78.0–100.0)
PLATELETS: 225 10*3/uL (ref 150–400)
RBC: 4.1 MIL/uL — AB (ref 4.22–5.81)
RDW: 12.5 % (ref 11.5–15.5)
WBC: 6.1 10*3/uL (ref 4.0–10.5)

## 2018-01-24 MED ORDER — FAMOTIDINE 20 MG PO TABS: 20.0000 mg | ORAL_TABLET | Freq: Two times a day (BID) | ORAL | 0 refills | Status: DC

## 2018-01-24 MED ORDER — ACETAMINOPHEN 325 MG PO TABS
650.0000 mg | ORAL_TABLET | Freq: Once | ORAL | Status: AC
  Administered 2018-01-24: 650 mg via ORAL
  Filled 2018-01-24: qty 2

## 2018-01-24 NOTE — Discharge Instructions (Signed)

## 2018-01-24 NOTE — ED Notes (Signed)
PT states understanding of care given, follow up care, and medication prescribed. PT ambulated from ED to car with a steady gait. 

## 2018-01-24 NOTE — ED Provider Notes (Signed)
Patient placed in Quick Look pathway, seen and evaluated   Chief Complaint: cp  HPI:   Intermittent exertional cp along with fatigue, nausea and sob x 2-3 weeks  ROS: no fever, productive cough, hemoptysis (one)  Physical Exam:   Gen: No distress  Neuro: Awake and Alert  Skin: Warm    Focused Exam: heart RRR no MRG, lungs CTAB, abd soft nontender   Initiation of care has begun. The patient has been counseled on the process, plan, and necessity for staying for the completion/evaluation, and the remainder of the medical screening examination    Fayrene Helperran, Vikram Tillett, Cordelia Poche-C 01/24/18 1726    Eber HongMiller, Brian, MD 01/27/18 [phone]

## 2018-01-24 NOTE — ED Notes (Signed)
Pt states his pain has been for the 3 weeks comes and goes and when the pain starts its 3 short intervals that last about 3-5 minutes but along with that he becomes sick and nauseous feeling. This feeling has been for 3 weeks also and today became worst. Pt also states he has a mild headache that has been going on for the same time period.

## 2018-01-24 NOTE — ED Triage Notes (Signed)
Pt reports CP x several weeks, pt noticed the pain early today, SHOB on exertion. Pt denies CP currently. Pt reports nausea and headache, denies vomiting, lightheadedness

## 2018-01-24 NOTE — ED Provider Notes (Signed)
[facility] EMERGENCY DEPARTMENT Provider Note   CSN: [id number] Arrival date & time: 01/24/18  1718     History   Chief Complaint Chief Complaint  Patient presents with  . Chest Pain    HPI Glen Rivera is a 57 y.o. male who presents emergency department with chief complaint of chest pain.  Patient states that for the past 2 weeks he has had fleeting episodes of sharp left-sided chest pain lasting less than 10 seconds at a time.  Patient states that what concerned him was that he was having a little bit of a headache today and has been feeling tired and has had a "queasy feeling in my chest."  He denies nausea, vomiting, radiating pain into the jaw neck or arm, diaphoresis, shortness of breath or exertional dyspnea.  He has a history of hyperlipidemia but denies a history of hypertension, smoking, known coronary artery disease or family history of early myocardial infarction or early heart disease.  His father does have a stent but this was placed in his late 50s and he needed a valve replacement.  Patient has no current chest pain at this time.  He denies pleuritic chest pain, unilateral leg swelling, tachycardia, or hemoptysis.  He has not had any recent confinement, cancer, or other risk factors for pulmonary embolus.  He denies fevers, chills  HPI  Past Medical History:  Diagnosis Date  . Anxiety   . Hyperlipidemia   . Obesity   . OSA (obstructive sleep apnea) 2003    Patient Active Problem List   Diagnosis Date Noted  . Chest pain 09/16/2011  . Obstructive sleep apnea 09/04/2009    History reviewed. No pertinent surgical history.      Home Medications    Prior to Admission medications   Medication Sig Start Date End Date Taking? Authorizing Provider  atorvastatin (LIPITOR) 20 MG tablet Take 1 tablet by mouth daily. 11/09/12  Yes [provider]  indomethacin (INDOCIN) 50 MG capsule TAKE ONE CAPSULE TWICE A DAY WITH FOOD OR MILK 11/22/17  Yes  [provider]    Family History History reviewed. No pertinent family history.  Social History Social History   Tobacco Use  . Smoking status: Never Smoker  . Smokeless tobacco: Never Used  Substance Use Topics  . Alcohol use: Yes    Comment: several beers a night  . Drug use: No     Allergies   Patient has no known allergies.   Review of Systems Review of Systems  Ten systems reviewed and are negative for acute change, except as noted in the HPI.   Physical Exam Updated Vital Signs BP (!) 133/98 (BP Location: Left Arm)   Pulse 98   Temp 98.6 F (37 C) (Oral)   Resp 16   Ht 5\' 8"  (1.727 m)   Wt 99.8 kg (220 lb)   SpO2 98%   BMI 33.45 kg/m   Physical Exam  Constitutional: He appears well-developed and well-nourished. No distress.  HENT:  Head: Normocephalic and atraumatic.  Eyes: Conjunctivae are normal. No scleral icterus.  Neck: Normal range of motion. Neck supple. No JVD present.  Cardiovascular: Normal rate, regular rhythm, normal heart sounds and normal pulses. Exam reveals no gallop.  No murmur heard. Pulmonary/Chest: Effort normal and breath sounds normal. No respiratory distress.  Abdominal: Soft. There is no tenderness.  Musculoskeletal: He exhibits no edema.       Right lower leg: Normal. He exhibits no edema.  Left lower leg: Normal. He exhibits no edema.  Neurological: He is alert.  Skin: Skin is warm and dry. He is not diaphoretic.  Psychiatric: His behavior is normal.  Nursing note and vitals reviewed.    ED Treatments / Results  Labs (all labs ordered are listed, but only abnormal results are displayed) Labs Reviewed  BASIC METABOLIC PANEL - Abnormal; Notable for the following components:      Result Value   Glucose, Bld 101 (*)    BUN 25 (*)    All other components within normal limits  CBC - Abnormal; Notable for the following components:   RBC 4.10 (*)    Hemoglobin 12.3 (*)    All other components within normal  limits  I-STAT TROPONIN, ED  I-STAT TROPONIN, ED    EKG None    ED ECG REPORT   Date: 01/24/2018  Rate: 67  Rhythm: normal sinus rhythm  QRS Axis: normal  Intervals: normal  ST/T Wave abnormalities: normal  Conduction Disutrbances:none  Narrative Interpretation: abnormal ecg without signs of acute ishcemia  Old EKG Reviewed: changes noted  I have personally reviewed the EKG tracing and agree with the computerized printout as noted.   Radiology Dg Chest 2 View  Result Date: 01/24/2018 CLINICAL DATA:  Chest pain EXAM: CHEST - 2 VIEW COMPARISON:  None. FINDINGS: Normal heart size. Normal mediastinal contour. No pneumothorax. No pleural effusion. Lungs appear clear, with no acute consolidative airspace disease and no pulmonary edema. IMPRESSION: No active cardiopulmonary disease. Electronically Signed   By: Delbert PhenixJason A Poff M.D.   On: 01/24/2018 18:13    Procedures Procedures (including critical care time)  Medications Ordered in ED Medications  acetaminophen (TYLENOL) tablet 650 mg (650 mg Oral Given 01/24/18 2152)     Initial Impression / Assessment and Plan / ED Course  I have reviewed the triage vital signs and the nursing notes.  Pertinent labs & imaging results that were available during my care of the patient were reviewed by me and considered in my medical decision making (see chart for details).    The emergent differential diagnosis of chest pain includes: Acute coronary syndrome, pericarditis, aortic dissection, pulmonary embolism, tension pneumothorax, pneumonia, and esophageal rupture.  .Patient is to be discharged with recommendation to follow up with PCP in regards to today's hospital visit. Chest pain is not likely of cardiac or pulmonary etiology d/t presentation,, VSS, no tracheal deviation, no JVD or new murmur, RRR, breath sounds equal bilaterally, EKG without acute abnormalities, negative troponin, and negative CXR. Pt has been advised start a PPI and return  to the ED is CP becomes exertional, associated with diaphoresis or nausea, radiates to left jaw/arm, worsens or becomes concerning in any way. He is wells low risk and I have extremely low risk for PE. Pt appears reliable for follow up and is agreeable to discharge.      Final Clinical Impressions(s) / ED Diagnoses   Final diagnoses:  Atypical chest pain    ED Discharge Orders    None       Arthor CaptainHarris, Randee Upchurch, PA-C 01/24/18 2240    Eber HongMiller, Brian, MD 01/27/18 [phone]

## 2018-01-25 DIAGNOSIS — R0789 Other chest pain: Secondary | ICD-10-CM | POA: Diagnosis not present

## 2018-01-25 DIAGNOSIS — R5383 Other fatigue: Secondary | ICD-10-CM | POA: Diagnosis not present

## 2018-02-21 ENCOUNTER — Ambulatory Visit: Payer: Self-pay

## 2018-02-21 ENCOUNTER — Ambulatory Visit (INDEPENDENT_AMBULATORY_CARE_PROVIDER_SITE_OTHER): Payer: BLUE CROSS/BLUE SHIELD | Admitting: Sports Medicine

## 2018-02-21 ENCOUNTER — Encounter: Payer: Self-pay | Admitting: Sports Medicine

## 2018-02-21 VITALS — BP 122/84 | HR 60 | Ht 68.0 in | Wt 228.0 lb

## 2018-02-21 DIAGNOSIS — R079 Chest pain, unspecified: Secondary | ICD-10-CM | POA: Diagnosis not present

## 2018-02-21 DIAGNOSIS — G2589 Other specified extrapyramidal and movement disorders: Secondary | ICD-10-CM | POA: Diagnosis not present

## 2018-02-21 DIAGNOSIS — M75101 Unspecified rotator cuff tear or rupture of right shoulder, not specified as traumatic: Secondary | ICD-10-CM

## 2018-02-21 DIAGNOSIS — M25511 Pain in right shoulder: Secondary | ICD-10-CM

## 2018-02-21 MED ORDER — NITROGLYCERIN 0.2 MG/HR TD PT24: MEDICATED_PATCH | TRANSDERMAL | 1 refills | Status: DC

## 2018-02-21 NOTE — Progress Notes (Signed)
Veverly FellsMichael D. Delorise Shinerigby, DO  [facility] [place] [phone]  Glen Rivera - 57 y.o. male MRN [id number]  Date of birth: 1961-07-16  Visit Date: 02/21/2018  PCP: Farris HasMorrow, Aaron, MD   Referred by: Farris HasMorrow, Aaron, MD  Scribe(s) for today's visit: Stevenson ClinchBrandy Coleman, CMA  SUBJECTIVE:  Glen JohnsJames Jr Stagliano is here for Follow-up (R shoulder pain)   12/05/2017: His R shoulder pain symptoms INITIALLY: Began about 6 months ago and MOI is unknown. No past injuries to the R shoulder. Described as moderate stabbing and aching, radiating to the R arm but not into the hand.  Worsened with lifting objects, reaching up.  Improved with rest, but pain is still present.  Additional associated symptoms include: Pain is anterior. He has noticed weakness in the R arm.    At this time symptoms show no change compared to onset. Pain if off and on and varies is severity day to day. He has been taking Aleve or Tylenol with minimal relief.   02/21/2018: Compared to the last office visit, his previously described symptoms are worsening, he felt "like someone had a dagger" in him Sunday night. He reports doing more physical work than normal this past Friday, he attributes increased pain to this.  Current symptoms are mild now but have been severe. Pain is radiating to the R arm toward the elbow. He c/o difficulty lifting objects with his R arm. He reports weakness in R arm while fishing. He has noticed R-sided chest pain, was seen at the ED and will have stress test 02/28/18. He denies neck pain. He is able to swim in the evenings without the pain flaring up.  He has been taking Aleve prn with some relief.   He received steroid injection 12/05/2017 and responded well.    REVIEW OF SYSTEMS: Reports night time disturbances. Denies fevers, chills, or night sweats. Denies unexplained weight loss. Denies personal history of cancer. Denies changes in bowel or bladder habits. Denies recent  unreported falls. Denies new or worsening dyspnea or wheezing. Denies headaches or dizziness.  Reports weakness in R arm.  Denies dizziness or presyncopal episodes Denies lower extremity edema    HISTORY:  Prior history reviewed and updated per electronic medical record.  Social History   Occupational History  . Not on file  Tobacco Use  . Smoking status: Never Smoker  . Smokeless tobacco: Never Used  Substance and Sexual Activity  . Alcohol use: Yes    Comment: several beers a night  . Drug use: No  . Sexual activity: Yes   Social History   Social History Narrative  . Not on file     DATA OBTAINED & REVIEWED:  No results for input(s): HGBA1C, LABURIC, CREATINE in the last 8760 hours. .   OBJECTIVE:  VS:  HT:5\' 8"  (172.7 cm)   WT:228 lb (103.4 kg)  BMI:34.68    BP:122/84  HR:60bpm  TEMP: ( )  RESP:96 %   PHYSICAL EXAM: CONSTITUTIONAL: Well-developed, Well-nourished and In no acute distress PSYCHIATRIC: Alert & appropriately interactive. and Not depressed or anxious appearing. RESPIRATORY: No increased work of breathing and Trachea Midline EYES: Pupils are equal., EOM intact without nystagmus. and No scleral icterus.  VASCULAR EXAM: Warm and well perfused NEURO: unremarkable   MSK Exam: Right Shoulder Exam: Well aligned, no significant deformity. No overlying skin changes. Neck: normal range of motion and supple Non tender to palpation over: Bony Landmarks Axial loading produces: No pain and No  crepitation Drop arm test: negative  Internal Rotation:  ROM: Normal with no pain.   Strength: 5/5 External Rotation:  ROM: Normal with mild pain.   Strength: 5/5  Hawkins: positive, mild pain Neers: positive, mild pain  Empty Can: positive, mild pain Strength: 5/5 Speed's: normal, no pain Strength: Normal    ASSESSMENT   1. Right shoulder pain, unspecified chronicity   2. Rotator cuff syndrome of right shoulder   3. Scapular dyskinesis   4. Chest  pain, unspecified type     PLAN:  Pertinent additional documentation may be included in corresponding procedure notes, imaging studies, problem based documentation and patient instructions.  Procedures:  . None  Medications:  Meds ordered this encounter  Medications  . nitroGLYCERIN (NITRODUR - DOSED IN MG/24 HR) 0.2 mg/hr patch    Sig: Place 1/4 to 1/2 of a patch over affected region. Remove and replace once daily.  Slightly alter skin placement daily    Dispense:  30 patch    Refill:  1    For musculoskeletal purposes.  Okay to cut patch.   Discussion/Instructions: No problem-specific Assessment & Plan notes found for this encounter.  . Discussed options with the patient today including biologic treatment with topical nitroglycerin. Patient has no contraindications & understands the risks, benefits and intentions of treatment. Emphasized the importance of rotating sites as well as appropriate and expected adverse reactions including orthostasis, headache, adhesive sensitivity. Begin with 1/4 patch to the affected area. Okay to titrate to half a patch as tolerated. . Discussed on nitroglycerin should not have any adverse effect on his cardiac stress test I would like for him to discontinue this or even hold off on beginning this until after his stress test is completed.  Be sure to alert cardiology of this.  This was discussed in great detail with him. . Importance of home therapeutic exercises emphasized once again. . Continue previously prescribed home exercise program.  . Discussed red flag symptoms that warrant earlier emergent evaluation and patient voices understanding. . Activity modifications and the importance of avoiding exacerbating activities (limiting pain to no more than a 4 / 10 during or following activity) recommended and discussed.  Follow-up:  . Return in about 8 weeks (around 04/18/2018) for repeat diagnostic ultrasound.  . If any lack of improvement  consider: . further diagnostic evaluation with MR arthrogram of the shoulder.     CMA/ATC served as Neurosurgeon during this visit. History, Physical, and Plan performed by medical provider. Documentation and orders reviewed and attested to.      Andrena Mews, DO    [facility]ician

## 2018-02-21 NOTE — Procedures (Signed)
LIMITED MSK ULTRASOUND OF Right shoulder Images were obtained and interpreted by myself, Gaspar BiddingMichael Crescencio Jozwiak, DO  Images have been saved and stored to PACS system. Images obtained on: GE S7 Ultrasound machine  FINDINGS:  Biceps Tendon: Slight thickening of the biceps tendon with a small amount of halo sign consistent with Tendinopathic changes Pec Major Insertion: Normal Subscapularis Tendon: Slight thickening with slight interstitial tearing Supraspinatus Tendon: Slight thickening with interstitial tearing, no full-thickness tear appreciated Infraspinatus/Teres Minor Tendon: Normal AC Joint: Positive mushroom sign but no pain with sono palpation JOINT: No significant GH spurring appreciated  LABRUM: Not evaluated   IMPRESSION:  1. Tendinopathic changes of the biceps, subscap, AC joint and supraspinatus no full-thickness rotator cuff tear appreciated.

## 2018-02-21 NOTE — Patient Instructions (Addendum)

## 2018-02-23 DIAGNOSIS — D649 Anemia, unspecified: Secondary | ICD-10-CM | POA: Diagnosis not present

## 2018-02-23 DIAGNOSIS — R110 Nausea: Secondary | ICD-10-CM | POA: Diagnosis not present

## 2018-02-23 DIAGNOSIS — E538 Deficiency of other specified B group vitamins: Secondary | ICD-10-CM | POA: Diagnosis not present

## 2018-02-23 DIAGNOSIS — K921 Melena: Secondary | ICD-10-CM | POA: Diagnosis not present

## 2018-02-28 ENCOUNTER — Encounter: Payer: Self-pay | Admitting: Cardiology

## 2018-02-28 ENCOUNTER — Ambulatory Visit (INDEPENDENT_AMBULATORY_CARE_PROVIDER_SITE_OTHER): Payer: BLUE CROSS/BLUE SHIELD | Admitting: Cardiology

## 2018-02-28 VITALS — BP 130/84 | HR 57 | Ht 69.0 in | Wt 227.2 lb

## 2018-02-28 DIAGNOSIS — R079 Chest pain, unspecified: Secondary | ICD-10-CM | POA: Diagnosis not present

## 2018-02-28 DIAGNOSIS — Z7189 Other specified counseling: Secondary | ICD-10-CM | POA: Diagnosis not present

## 2018-02-28 DIAGNOSIS — G4733 Obstructive sleep apnea (adult) (pediatric): Secondary | ICD-10-CM | POA: Diagnosis not present

## 2018-02-28 DIAGNOSIS — R002 Palpitations: Secondary | ICD-10-CM

## 2018-02-28 NOTE — Progress Notes (Signed)
Cardiology Office Note:    Date:  02/28/2018   ID:  Glen Rivera, DOB 1960/11/24, MRN [id number]  PCP:  Farris Has, MD  Cardiologist:  Jodelle Red, MD PhD  Referring MD: Farris Has, MD   CC: chest discomfort  History of Present Illness:    Glen Rivera is a 57 y.o. male with a hx of HLD, obesity, OSA who is seen as a new consult at the request of Dr. Kateri Plummer for evaluation of chest discomfort.  He presented to the ER on 01/24/18 with intermittent chest pain, fatigue, nausea, and shortness of breath for several weeks. Chest pain was sharp, lasted less than 10 seconds. First three ECGs showed normal sinus rhythm, and final ECG noted atrial fibrillation with HR 110s--however the overall pattern does not look similar to prior ECGs. No telemetry strips saved that I can see. Troponins were negative. Doesn't think the PPI helped his symptoms.   Patient concerns: several weeks of feeling off with discomfort in his chest. No clear aggravating or alleviating factors. Has a stressful job but feels that he handles stress well. Not related to food, position. May notice it more when he is sitting still. A week or so ago he had an episode that lasted about the entire day, but it was also a day when he was having severe right shoulder pain. That day he was feeling nauseated. Had an ulcer in his 50s, not sure if it is back but he has blood in his stool currently. Pain is not like his prior ulcer. Also had panic attacks in the past, but this doesn't feel like that, and he hasn't had panic symptoms in many years.   Has been active, loading rocks, etc and sometimes notices nausea and sharp pain. Once he felt like he had to gasp for air. No lightheadedness or diaphoresis.   No real palpitations, but does notice amybe every 3 mos that he has a strange/hard beat in his chest.   Has stress test about 10 years, was told it was normal.   OSA: uses CPAP routinely.  Was given NG patch by his  orthopedist for his shoulder pain--was told his bicep may not be getting enough blood flow.  Risk factors:  Never smoker, no hypertension, no diabetes. Hyperlipidemia: on atorvastatin for about the last 5 years. Last lipids 11/2017. Diet/exercise: not very careful about his diet. Active with his job.  Father: age 45, has had a heart valve replaced, has had several stents, first in his late 48s.   Past Medical History:  Diagnosis Date  . Anxiety   . Hyperlipidemia   . Obesity   . OSA (obstructive sleep apnea) 2003    No past surgical history on file.  Current Medications: Current Outpatient Medications on File Prior to Visit  Medication Sig  . atorvastatin (LIPITOR) 20 MG tablet Take 1 tablet by mouth daily.  . clonazePAM (KLONOPIN) 0.5 MG tablet Take 0.5 mg by mouth daily as needed for anxiety (panic attacks).  . Cyanocobalamin (B-12 PO) Take by mouth.  . indomethacin (INDOCIN) 50 MG capsule TAKE ONE CAPSULE TWICE A DAY WITH FOOD OR MILK  . nitroGLYCERIN (NITRODUR - DOSED IN MG/24 HR) 0.2 mg/hr patch Place 1/4 to 1/2 of a patch over affected region. Remove and replace once daily.  Slightly alter skin placement daily  . omeprazole (PRILOSEC) 20 MG capsule Take 1 capsule by mouth daily.  . sildenafil (REVATIO) 20 MG tablet Take 20-60 mg by mouth daily as needed.  No current facility-administered medications on file prior to visit.      Allergies:   Patient has no known allergies.   Social History   Socioeconomic History  . Marital status: Married    Spouse name: Not on file  . Number of children: Not on file  . Years of education: Not on file  . Highest education level: Not on file  Occupational History  . Not on file  Social Needs  . Financial resource strain: Not on file  . Food insecurity:    Worry: Not on file    Inability: Not on file  . Transportation needs:    Medical: Not on file    Non-medical: Not on file  Tobacco Use  . Smoking status: Never Smoker  .  Smokeless tobacco: Never Used  Substance and Sexual Activity  . Alcohol use: Yes    Comment: several beers a night  . Drug use: No  . Sexual activity: Yes  Lifestyle  . Physical activity:    Days per week: Not on file    Minutes per session: Not on file  . Stress: Not on file  Relationships  . Social connections:    Talks on phone: Not on file    Gets together: Not on file    Attends religious service: Not on file    Active member of club or organization: Not on file    Attends meetings of clubs or organizations: Not on file    Relationship status: Not on file  Other Topics Concern  . Not on file  Social History Narrative  . Not on file   Married for 18 years, two children, No tobacco. EtOH: about 12 beers/week  Family History: The patient's FH is pertinent for father with replaced heart valve and cardiac stents in his late 80s/early 4190s, still living at age 59. Mother passed away age 59.   ROS:   Please see the history of present illness.  Additional pertinent ROS: Review of Systems  Constitutional: Negative for chills and fever.  HENT: Negative for ear pain and hearing loss.   Eyes: Negative for blurred vision and pain.  Respiratory: Negative for cough, sputum production and shortness of breath.   Cardiovascular: Positive for chest pain and palpitations. Negative for orthopnea, claudication and leg swelling.  Gastrointestinal: Positive for nausea. Negative for abdominal pain, blood in stool and melena.  Genitourinary: Negative for dysuria and hematuria.  Musculoskeletal: Positive for joint pain and myalgias.  Skin: Negative for rash.  Neurological: Negative for sensory change and loss of consciousness.  Endo/Heme/Allergies: Does not bruise/bleed easily.     EKGs/Labs/Other Studies Reviewed:    The following studies were reviewed today: [facility]  EKG:  EKG is ordered today.  The ekg ordered today demonstrates sinus bradycardia  Recent  Labs: 01/24/2018: BUN 25; Creatinine, Ser 1.02; Hemoglobin 12.3; Platelets 225; Potassium 4.0; Sodium 139  Recent Lipid Panel No results found for: CHOL, TRIG, HDL, CHOLHDL, VLDL, LDLCALC, LDLDIRECT   [facility] Tchol 183, HDL 67, LDL 102, TG 71   Physical Exam:    VS:  BP 130/84 (BP Location: Left Arm, Patient Position: Supine, Cuff Size: Normal)   Pulse (!) 57   Ht 5\' 9"  (1.753 m)   Wt 227 lb 3.2 oz (103.1 kg)   BMI 33.55 kg/m     Wt Readings from Last 3 Encounters:  02/28/18 227 lb 3.2 oz (103.1 kg)  02/21/18 228 lb (103.4 kg)  01/24/18  220 lb (99.8 kg)     GEN: Well nourished, well developed in no acute distress HEENT: Normal NECK: No JVD; No carotid bruits LYMPHATICS: No lymphadenopathy CARDIAC: regular rhythm, normal S1 and S2, no murmurs, rubs, gallops. Radial and DP pulses 2+ bilaterally. RESPIRATORY:  Clear to auscultation without rales, wheezing or rhonchi  ABDOMEN: Soft, non-tender, non-distended MUSCULOSKELETAL:  No edema; No deformity  SKIN: Warm and dry NEUROLOGIC:  Alert and oriented x 3 PSYCHIATRIC:  Normal affect   ASSESSMENT:    1. Chest pain, unspecified type   2. Palpitations   3. Obstructive sleep apnea   4. Counseling on health promotion and disease prevention    PLAN:    1. Chest pain: atypical in many respects, but he is nearly [age] and his father has a history of CAD (later in life). Risk factors: no DM, HTN, or tobacco but include BMI, HLD, OSA. -treadmill stress to evaluate for ischemia. -ECG today unremarkable -ASCVD 10 year risk 6% based on today's numbers and prior lipids -continue atorvastatin -continue CPAP for OSA -prevention: goals for ideal weight, Mediterranean/AHA recommended diet, 30 minutes of moderate exercise 3-5 times/week.  2. Palpitations: has one ECG from his recent ER visit that does not fit others--first several were sinus rhythm, then he had afib with RVR. He does not remember being told that he has afib, and  there is no treatment of it in the ER notes. Unclear if this was truly his ECG or mislabeled. He does report rare palpitations.  -Will look for paroxysmal afib with event monitor.  Plan for follow up: PRN based on test results.  Medication Adjustments/Labs and Tests Ordered: Current medicines are reviewed at length with the patient today.  Concerns regarding medicines are outlined above.  Orders Placed This Encounter  Procedures  . CARDIAC EVENT MONITOR  . EXERCISE TOLERANCE TEST (ETT)  . EKG 12-Lead   No orders of the defined types were placed in this encounter.   Patient Instructions  Your physician has requested that you have an exercise tolerance test. For further information please visit [email or web address]. Please also follow instruction sheet, as given. -- done at Dr. Di Kindlehristopher's office  Your physician has recommended that you wear an event monitor for 2 weeks. Event monitors are medical devices that record the heart's electrical activity. Doctors most often us these monitors to diagnose arrhythmias. Arrhythmias are problems with the speed or rhythm of the heartbeat. The monitor is a small, portable device. You can wear one while you do your normal daily activities. This is usually used to diagnose what is causing palpitations/syncope (passing out). -- done at [address]  Your physician recommends that you schedule a follow-up appointment as needed. We will call you with test results.      Signed, Jodelle RedBridgette Isamar Wellbrock, MD PhD 02/28/2018 12:34 PM     [facility]

## 2018-02-28 NOTE — Patient Instructions (Signed)
Your physician has requested that you have an exercise tolerance test. For further information please visit [email or web address]. Please also follow instruction sheet, as given. -- done at Dr. Di Kindlehristopher's office  Your physician has recommended that you wear an event monitor for 2 weeks. Event monitors are medical devices that record the heart's electrical activity. Doctors most often us these monitors to diagnose arrhythmias. Arrhythmias are problems with the speed or rhythm of the heartbeat. The monitor is a small, portable device. You can wear one while you do your normal daily activities. This is usually used to diagnose what is causing palpitations/syncope (passing out). -- done at [address]  Your physician recommends that you schedule a follow-up appointment as needed. We will call you with test results.

## 2018-03-02 ENCOUNTER — Telehealth (HOSPITAL_COMMUNITY): Payer: Self-pay

## 2018-03-02 NOTE — Telephone Encounter (Signed)
Encounter complete. 

## 2018-03-07 ENCOUNTER — Ambulatory Visit (HOSPITAL_COMMUNITY)
Admission: RE | Admit: 2018-03-07 | Discharge: 2018-03-07 | Disposition: A | Discharge status: Home / Self Care | Payer: BLUE CROSS/BLUE SHIELD | Source: Ambulatory Visit | Attending: Cardiovascular Disease | Admitting: Cardiovascular Disease

## 2018-03-07 DIAGNOSIS — R079 Chest pain, unspecified: Secondary | ICD-10-CM | POA: Diagnosis not present

## 2018-03-07 LAB — EXERCISE TOLERANCE TEST
CSEPED: 6 min
CSEPHR: 93 %
Estimated workload: 7.5 METS
Exercise duration (sec): 20 s
MPHR: 161 {beats}/min
Peak HR: 151 {beats}/min
RPE: 19
Rest HR: 59 {beats}/min

## 2018-03-13 ENCOUNTER — Ambulatory Visit (INDEPENDENT_AMBULATORY_CARE_PROVIDER_SITE_OTHER): Payer: BLUE CROSS/BLUE SHIELD

## 2018-03-13 DIAGNOSIS — R002 Palpitations: Secondary | ICD-10-CM | POA: Diagnosis not present

## 2018-03-17 ENCOUNTER — Other Ambulatory Visit: Payer: Self-pay | Admitting: Sports Medicine

## 2018-03-29 DIAGNOSIS — K625 Hemorrhage of anus and rectum: Secondary | ICD-10-CM | POA: Diagnosis not present

## 2018-03-29 DIAGNOSIS — K5900 Constipation, unspecified: Secondary | ICD-10-CM | POA: Diagnosis not present

## 2018-04-04 ENCOUNTER — Encounter: Payer: Self-pay | Admitting: Sports Medicine

## 2018-04-15 ENCOUNTER — Other Ambulatory Visit: Payer: Self-pay | Admitting: Sports Medicine

## 2018-04-18 ENCOUNTER — Encounter: Payer: Self-pay | Admitting: Sports Medicine

## 2018-04-18 ENCOUNTER — Ambulatory Visit (INDEPENDENT_AMBULATORY_CARE_PROVIDER_SITE_OTHER): Payer: BLUE CROSS/BLUE SHIELD | Admitting: Sports Medicine

## 2018-04-18 VITALS — BP 110/78 | HR 68 | Ht 69.0 in | Wt 225.4 lb

## 2018-04-18 DIAGNOSIS — G2589 Other specified extrapyramidal and movement disorders: Secondary | ICD-10-CM

## 2018-04-18 DIAGNOSIS — M75101 Unspecified rotator cuff tear or rupture of right shoulder, not specified as traumatic: Secondary | ICD-10-CM | POA: Diagnosis not present

## 2018-04-18 NOTE — Progress Notes (Signed)
Glen Rivera. [facility] at [facility] [phone]  Glen Rivera - 57 y.o. male MRN [id number]  Date of birth: [birth year]  Visit Date: 04/18/2018  PCP: Farris Has, MD   Referred by: Farris Has, MD  Scribe(s) for today's visit: Stevenson Clinch, CMA  SUBJECTIVE:  Glen Rivera is here for Follow-up (R shoulder pain)   12/05/2017: His R shoulder pain symptoms INITIALLY: Began about 6 months ago and MOI is unknown. No past injuries to the R shoulder. Described as moderate stabbing and aching, radiating to the R arm but not into the hand.  Worsened with lifting objects, reaching up.  Improved with rest, but pain is still present.  Additional associated symptoms include: Pain is anterior. He has noticed weakness in the R arm.    At this time symptoms show no change compared to onset. Pain if off and on and varies is severity day to day. He has been taking Aleve or Tylenol with minimal relief.   02/21/2018: Compared to the last office visit, his previously described symptoms are worsening, he felt "like someone had a dagger" in him Sunday night. He reports doing more physical work than normal this past Friday, he attributes increased pain to this.  Current symptoms are mild now but have been severe. Pain is radiating to the R arm toward the elbow. He c/o difficulty lifting objects with his R arm. He reports weakness in R arm while fishing. He has noticed R-sided chest pain, was seen at the ED and will have stress test 02/28/18. He denies neck pain. He is able to swim in the evenings without the pain flaring up.  He has been taking Aleve prn with some relief.   He received steroid injection 12/05/2017 and responded well.   04/18/2018: Compared to the last office visit on 02/21/18, his previously described R shoulder symptoms are improving w/ decreased pain noted.  He states that he has been wearing the nitro patches for about a month.  He notes  85% improvement and feels that weakness is his main symptom. Current symptoms are mild & are nonradiating He has been following the nitro protocol.  He had a R shld injection on 12/05/17.  REVIEW OF SYSTEMS: Denies night time disturbances. Denies fevers, chills, or night sweats. Denies unexplained weight loss. Denies personal history of cancer. Denies changes in bowel or bladder habits. Denies recent unreported falls. Denies new or worsening dyspnea or wheezing. Denies headaches or dizziness.  Reports weakness in R arm.  Denies dizziness or presyncopal episodes Denies lower extremity edema    HISTORY:  Prior history reviewed and updated per electronic medical record.  Social History   Occupational History  . Not on file  Tobacco Use  . Smoking status: Never Smoker  . Smokeless tobacco: Never Used  Substance and Sexual Activity  . Alcohol use: Yes    Comment: several beers a night  . Drug use: No  . Sexual activity: Yes   Social History   Social History Narrative  . Not on file     DATA OBTAINED & REVIEWED:  No results for input(s): HGBA1C, LABURIC, CREATINE in the last 8760 hours. .   OBJECTIVE:  VS:  HT:5\' 9"  (175.3 cm)   WT:225 lb 6.4 oz (102.2 kg)  BMI:33.27    BP:110/78  HR:68bpm  TEMP: ( )  RESP:94 %   PHYSICAL EXAM: CONSTITUTIONAL: Well-developed, Well-nourished and In no acute distress PSYCHIATRIC: Alert & appropriately  interactive. and Not depressed or anxious appearing. RESPIRATORY: No increased work of breathing and Trachea Midline EYES: Pupils are equal., EOM intact without nystagmus. and No scleral icterus.  VASCULAR EXAM: Warm and well perfused NEURO: unremarkable   MSK Exam: Right Shoulder Exam: Well aligned, no significant deformity. No overlying skin changes. Neck: normal range of motion and supple Non tender to palpation over: Bony Landmarks Axial loading produces: No pain and No crepitation Drop arm test: negative  Internal  Rotation:  ROM: Normal with no pain.   Strength: 5/5 External Rotation:  ROM: Normal with mild pain.   Strength: 5/5  Hawkins: positive, mild pain Neers: positive, mild pain  Empty Can: positive, mild pain Strength: 5/5 Speed's: normal, no pain Strength: Normal    ASSESSMENT   1. Rotator cuff syndrome of right shoulder   2. Scapular dyskinesis     PLAN:  Pertinent additional documentation may be included in corresponding procedure notes, imaging studies, problem based documentation and patient instructions.  Procedures:  . None  Medications:  No orders of the defined types were placed in this encounter.  Discussion/Instructions: No problem-specific Assessment & Plan notes found for this encounter.  . Continue previously prescribed home exercise program.  . Discussed red flag symptoms that warrant earlier emergent evaluation and patient voices understanding. . Activity modifications and the importance of avoiding exacerbating activities (limiting pain to no more than a 4 / 10 during or following activity) recommended and discussed.  . >50% of this 25 minute visit spent in direct patient counseling and/or coordination of care.  Discussion was focused on education regarding the in discussing the pathoetiology and anticipated clinical course of the above condition.  Follow-up:  . Return in about 6 weeks (around 05/30/2018) for repeat clinical exam, repeat diagnostic ultrasound.       CMA/ATC served as Neurosurgeon during this visit. History, Physical, and Plan performed by medical provider. Documentation and orders reviewed and attested to.      Andrena Mews, DO    [facility] Physician

## 2018-05-01 ENCOUNTER — Other Ambulatory Visit: Payer: Self-pay | Admitting: Sports Medicine

## 2018-05-03 ENCOUNTER — Other Ambulatory Visit: Payer: Self-pay | Admitting: Sports Medicine

## 2018-05-30 ENCOUNTER — Ambulatory Visit: Payer: BLUE CROSS/BLUE SHIELD | Admitting: Sports Medicine

## 2018-05-30 NOTE — Progress Notes (Deleted)
Glen Rivera. [facility] at [facility] [phone]  Glen Rivera - 57 y.o. male MRN [id number]  Date of birth: [birth year]  Visit Date: 05/30/2018  PCP: Glen Has, MD   Referred by: Glen Has, MD  Scribe(s) for today's visit: Glen Rivera, CMA  SUBJECTIVE:  Glen Rivera is here for No chief complaint on file.   12/05/2017: His R shoulder pain symptoms INITIALLY: Began about 6 months ago and MOI is unknown. No past injuries to the R shoulder. Described as moderate stabbing and aching, radiating to the R arm but not into the hand.  Worsened with lifting objects, reaching up.  Improved with rest, but pain is still present.  Additional associated symptoms include: Pain is anterior. He Rivera noticed weakness in the R arm.    At this time symptoms show no change compared to onset. Pain if off and on and varies is severity day to day. He Rivera been taking Aleve or Tylenol with minimal relief.   02/21/2018: Compared to the last office visit, his previously described symptoms are worsening, he felt "like someone had a dagger" in him Sunday night. He reports doing more physical work than normal this past Friday, he attributes increased pain to this.  Current symptoms are mild now but have been severe. Pain is radiating to the R arm toward the elbow. He c/o difficulty lifting objects with his R arm. He reports weakness in R arm while fishing. He Rivera noticed R-sided chest pain, was seen at the ED and will have stress test 02/28/18. He denies neck pain. He is able to swim in the evenings without the pain flaring up.  He Rivera been taking Aleve prn with some relief.  He received steroid injection 12/05/2017 and responded well.   04/18/2018: Compared to the last office visit on 02/21/18, his previously described R shoulder symptoms are improving w/ decreased pain noted.  He states that he Rivera been wearing the nitro patches for about a month.  He notes  85% improvement and feels that weakness is his main symptom. Current symptoms are mild & are nonradiating He Rivera been following the nitro protocol.  He had a R shld injection on 12/05/17.  05/30/2018: Compared to the last office visit, his previously described symptoms {Improving/worsening/no change:60406} {*describe change} Current symptoms are {DESC; MILD/MOD/SEVERE:15682} & are {Pain radiation-gi:31246} He Rivera been {*therapies/medications/ice/heat/bracing} {prev rx:317276}  REVIEW OF SYSTEMS: Denies night time disturbances. Denies fevers, chills, or night sweats. Denies unexplained weight loss. Denies personal history of cancer. Denies changes in bowel or bladder habits. Denies recent unreported falls. Denies new or worsening dyspnea or wheezing. Denies headaches or dizziness.  Reports weakness in R arm.  Denies dizziness or presyncopal episodes Denies lower extremity edema    HISTORY:  Prior history reviewed and updated per electronic medical record.  Social History   Occupational History  . Not on file  Tobacco Use  . Smoking status: Never Smoker  . Smokeless tobacco: Never Used  Substance and Sexual Activity  . Alcohol use: Yes    Comment: several beers a night  . Drug use: No  . Sexual activity: Yes   Social History   Social History Narrative  . Not on file     DATA OBTAINED & REVIEWED:  No results for input(s): HGBA1C, LABURIC, CREATINE in the last 8760 hours. .   OBJECTIVE:  VS:  HT:    WT:   BMI:  BP:   HR: bpm  TEMP: ( )  RESP:    PHYSICAL EXAM: CONSTITUTIONAL: Well-developed, Well-nourished and In no acute distress PSYCHIATRIC: Alert & appropriately interactive. and Not depressed or anxious appearing. RESPIRATORY: No increased work of breathing and Trachea Midline EYES: Pupils are equal., EOM intact without nystagmus. and No scleral icterus.  VASCULAR EXAM: Warm and well perfused NEURO: unremarkable   MSK Exam: Right Shoulder  Exam: Well aligned, no significant deformity. No overlying skin changes. Neck: normal range of motion and supple Non tender to palpation over: Bony Landmarks Axial loading produces: No pain and No crepitation Drop arm test: negative  Internal Rotation:  ROM: Normal with no pain.   Strength: 5/5 External Rotation:  ROM: Normal with mild pain.   Strength: 5/5  Hawkins: positive, mild pain Neers: positive, mild pain  Empty Can: positive, mild pain Strength: 5/5 Speed's: normal, no pain Strength: Normal    ASSESSMENT   No diagnosis found.  PLAN:  Pertinent additional documentation may be included in corresponding procedure notes, imaging studies, problem based documentation and patient instructions.  Procedures:  . None  Medications:  No orders of the defined types were placed in this encounter.  Discussion/Instructions: No problem-specific Assessment & Plan notes found for this encounter.  . Discussed options with the patient today including biologic treatment with topical nitroglycerin. Patient Rivera no contraindications & understands the risks, benefits and intentions of treatment. Emphasized the importance of rotating sites as well as appropriate and expected adverse reactions including orthostasis, headache, adhesive sensitivity. Begin with 1/4 patch to the affected area. Okay to titrate to half a patch as tolerated. . Discussed on nitroglycerin should not have any adverse effect on his cardiac stress test I would like for him to discontinue this or even hold off on beginning this until after his stress test is completed.  Be sure to alert cardiology of this.  This was discussed in great detail with him. . Importance of home therapeutic exercises emphasized once again. . Continue previously prescribed home exercise program.  . Discussed red flag symptoms that warrant earlier emergent evaluation and patient voices understanding. . Activity modifications and the importance of  avoiding exacerbating activities (limiting pain to no more than a 4 / 10 during or following activity) recommended and discussed.  Follow-up:  . No follow-ups on file.  . If any lack of improvement consider: . further diagnostic evaluation with MR arthrogram of the shoulder.     CMA/ATC served as Neurosurgeon during this visit. History, Physical, and Plan performed by medical provider. Documentation and orders reviewed and attested to.      Andrena Mews, DO    [place] [facility] Physician

## 2018-06-05 ENCOUNTER — Encounter: Payer: Self-pay | Admitting: Sports Medicine

## 2018-06-05 ENCOUNTER — Ambulatory Visit: Payer: BLUE CROSS/BLUE SHIELD | Admitting: Sports Medicine

## 2018-06-05 VITALS — BP 128/80 | HR 72 | Ht 69.0 in | Wt 229.2 lb

## 2018-06-05 DIAGNOSIS — M75101 Unspecified rotator cuff tear or rupture of right shoulder, not specified as traumatic: Secondary | ICD-10-CM

## 2018-06-05 DIAGNOSIS — G2589 Other specified extrapyramidal and movement disorders: Secondary | ICD-10-CM

## 2018-06-05 DIAGNOSIS — M7711 Lateral epicondylitis, right elbow: Secondary | ICD-10-CM

## 2018-06-05 DIAGNOSIS — M25511 Pain in right shoulder: Secondary | ICD-10-CM | POA: Diagnosis not present

## 2018-06-05 NOTE — Progress Notes (Signed)
Glen Rivera. Glen Rivera Sports Medicine [facility] at [facility] [phone]  [name] - 57 y.o. male MRN [id number]  Date of birth: October 20, 1960  Visit Date: 06/05/2018  PCP: Farris Has, MD   Referred by: Farris Has, MD  Scribe(s) for today's visit: [name], LAT, ATC  SUBJECTIVE:  Glen Rivera is here for Follow-up (R shoulder pain)   12/05/2017: His R shoulder pain symptoms INITIALLY: Began about 6 months ago and MOI is unknown. No past injuries to the R shoulder. Described as moderate stabbing and aching, radiating to the R arm but not into the hand.  Worsened with lifting objects, reaching up.  Improved with rest, but pain is still present.  Additional associated symptoms include: Pain is anterior. He has noticed weakness in the R arm.    At this time symptoms show no change compared to onset. Pain if off and on and varies is severity day to day. He has been taking Aleve or Tylenol with minimal relief.   02/21/2018: Compared to the last office visit, his previously described symptoms are worsening, he felt "like someone had a dagger" in him Sunday night. He reports doing more physical work than normal this past Friday, he attributes increased pain to this.  Current symptoms are mild now but have been severe. Pain is radiating to the R arm toward the elbow. He c/o difficulty lifting objects with his R arm. He reports weakness in R arm while fishing. He has noticed R-sided chest pain, was seen at the ED and will have stress test 02/28/18. He denies neck pain. He is able to swim in the evenings without the pain flaring up.  He has been taking Aleve prn with some relief.   He received steroid injection 12/05/2017 and responded well.   04/18/2018: Compared to the last office visit on 02/21/18, his previously described R shoulder symptoms are improving w/ decreased pain noted.  He states that he has been wearing the nitro patches for about a month.  He  notes 85% improvement and feels that weakness is his main symptom. Current symptoms are mild & are nonradiating He has been following the nitro protocol.  He had a R shld injection on 12/05/17.  06/05/2018: Compared to the last office visit on 04/18/18, his previously described R shoulder symptoms are improving but not much improved from the last visit.  He rates his improvement at 85%.  His biggest c/o is w/ weakness in his R UE especially w/ OH motion and reaching behind him when in the car. Current symptoms are mild & are nonradiating He has been following the nitro protocol and doing his HEP 3x/week.  REVIEW OF SYSTEMS: Denies night time disturbances. Denies fevers, chills, or night sweats. Denies unexplained weight loss. Denies personal history of cancer. Denies changes in bowel or bladder habits. Denies recent unreported falls. Denies new or worsening dyspnea or wheezing. Denies headaches or dizziness.  Reports weakness in R arm.  Denies dizziness or presyncopal episodes Denies lower extremity edema    HISTORY:  Prior history reviewed and updated per electronic medical record.  Social History   Occupational History  . Not on file  Tobacco Use  . Smoking status: Never Smoker  . Smokeless tobacco: Never Used  Substance and Sexual Activity  . Alcohol use: Yes    Comment: several beers a night  . Drug use: No  . Sexual activity: Yes   Social History   Social History Narrative  .  Not on file     DATA OBTAINED & REVIEWED:  No results for input(s): HGBA1C, LABURIC, CREATINE in the last 8760 hours. .   OBJECTIVE:  VS:  HT:5\' 9"  (175.3 cm)   WT:229 lb 3.2 oz (104 kg)  BMI:33.83    BP:128/80  HR:72bpm  TEMP: ( )  RESP:94 %   PHYSICAL EXAM: CONSTITUTIONAL: Well-developed, Well-nourished and In no acute distress PSYCHIATRIC: Alert & appropriately interactive. and Not depressed or anxious appearing. RESPIRATORY: No increased work of breathing and Trachea  Midline EYES: Pupils are equal., EOM intact without nystagmus. and No scleral icterus.  VASCULAR EXAM: Warm and well perfused NEURO: unremarkable   MSK Exam: Right Shoulder Exam: Well aligned, no significant deformity. No overlying skin changes. Neck: normal range of motion and supple Non tender to palpation over: Bony Landmarks Axial loading produces: No pain and No crepitation Drop arm test: negative  Internal Rotation:  ROM: Normal with no pain.   Strength: 5/5 External Rotation:  ROM: Normal with mild pain.   Strength: 4/5  Hawkins: normal, no pain Neers: positive, mild pain  Empty Can: positive, mild pain Strength: 5/5 Speed's: normal, no pain Strength: Normal  Right elbow: Overall well aligned.  Moderately tender over the common extensor tendons.  No pain over the medial elbow.  Good elbow flexion and extension.   ASSESSMENT   1. Rotator cuff syndrome of right shoulder   2. Scapular dyskinesis   3. Right shoulder pain, unspecified chronicity   4. Lateral epicondylitis of right elbow     PLAN:  Pertinent additional documentation may be included in corresponding procedure notes, imaging studies, problem based documentation and patient instructions.  Procedures:  . None  Medications:  No orders of the defined types were placed in this encounter.  Discussion/Instructions: Rotator cuff syndrome of right shoulder Overall improved but continues to have moderate weakness with external rotation with scapular dyskinesis.  Reviewed form for external rotation exercises with the Thera-Band.  Continue with nitroglycerin protocol.  Lateral epicondylitis of right elbow Moderate TTP over the right lateral epicondyles.  Should tolerate additional quarter of a patch nitroglycerin protocol well and will have him continue working on external rotation and eccentric wrist extension.  . Discussed options with the patient today including biologic treatment with topical nitroglycerin.  Patient has no contraindications & understands the risks, benefits and intentions of treatment. Emphasized the importance of rotating sites as well as appropriate and expected adverse reactions including orthostasis, headache, adhesive sensitivity. Begin with 1/4 patch to the affected area. Okay to titrate to half a patch as tolerated. .   . Importance of home therapeutic exercises emphasized once again. . Continue previously prescribed home exercise program.  . Discussed red flag symptoms that warrant earlier emergent evaluation and patient voices understanding. . Activity modifications and the importance of avoiding exacerbating activities (limiting pain to no more than a 4 / 10 during or following activity) recommended and discussed.  Follow-up:  . Return in about 8 weeks (around 07/31/2018).  Marland Kitchen. lbsmfollowup: consider further diagnostic evaluation with Plain film x-rays and/or MR arthrogram of the shoulder. and consider referral to Physical Therapy     CMA/ATC served as scribe during this visit. History, Physical, and Plan performed by medical provider. Documentation and orders reviewed and attested to.      Andrena MewsMichael D Nikyah Lackman, DO    [place] [facility] Physician

## 2018-06-05 NOTE — Assessment & Plan Note (Signed)
Moderate TTP over the right lateral epicondyles.  Should tolerate additional quarter of a patch nitroglycerin protocol well and will have him continue working on external rotation and eccentric wrist extension.

## 2018-06-05 NOTE — Assessment & Plan Note (Signed)
Overall improved but continues to have moderate weakness with external rotation with scapular dyskinesis.  Reviewed form for external rotation exercises with the Thera-Band.  Continue with nitroglycerin protocol.

## 2018-07-31 ENCOUNTER — Ambulatory Visit (INDEPENDENT_AMBULATORY_CARE_PROVIDER_SITE_OTHER): Payer: BLUE CROSS/BLUE SHIELD | Admitting: Sports Medicine

## 2018-07-31 ENCOUNTER — Encounter: Payer: Self-pay | Admitting: Sports Medicine

## 2018-07-31 VITALS — BP 110/72 | HR 66 | Ht 69.0 in | Wt 230.2 lb

## 2018-07-31 DIAGNOSIS — M7711 Lateral epicondylitis, right elbow: Secondary | ICD-10-CM

## 2018-07-31 DIAGNOSIS — G2589 Other specified extrapyramidal and movement disorders: Secondary | ICD-10-CM

## 2018-07-31 DIAGNOSIS — M75101 Unspecified rotator cuff tear or rupture of right shoulder, not specified as traumatic: Secondary | ICD-10-CM | POA: Diagnosis not present

## 2018-07-31 MED ORDER — NITROGLYCERIN 0.2 MG/HR TD PT24: MEDICATED_PATCH | TRANSDERMAL | 1 refills | Status: AC

## 2018-07-31 NOTE — Progress Notes (Signed)
Glen Rivera. [name] Sports Medicine [facility] at [facility] [phone]  Glen Rivera - 58 y.o. male MRN [id number]  Date of birth: Mar 22, 1961  Visit Date: 07/31/2018   PCP: Farris Has, MD   Referred by: Farris Has, MD  SUBJECTIVE:  Chief Complaint  Patient presents with  . f/u R shoulder    Following Nitro Protocol. Provided with HEP. Received steroid injection 12/05/17.   . f/u R elbow    HPI: Patient presents for follow-up of right shoulder and right elbow pain.  Here reports continuing to improve does have occasional stiffness and small amount of weakness with overhead reaching.  He is able to reach up above him and grabbed the soap while in the shower without significant symptoms or having to worry about this which is a change.  He has been using a quarter of the nitroglycerin patch on the shoulder but did not tolerate increasing this to shoulder and the elbow at last visit.  The elbow continues to bother him mainly only with fishing using a spinning reel.  REVIEW OF SYSTEMS: Otherwise 12 point review of systems performed and is negative  HISTORY:  Prior history reviewed and updated per electronic medical record.  Social History   Occupational History  . Not on file  Tobacco Use  . Smoking status: Never Smoker  . Smokeless tobacco: Never Used  Substance and Sexual Activity  . Alcohol use: Yes    Comment: several beers a night  . Drug use: No  . Sexual activity: Yes   Social History   Social History Narrative  . Not on file     DATA OBTAINED & REVIEWED:  Recent Labs    01/24/18 1737  CALCIUM 9.9   No problems updated. No specialty comments available.  OBJECTIVE:  VS:  HT:5\' 9"  (175.3 cm)   WT:230 lb 3.2 oz (104.4 kg)  BMI:33.98    BP:110/72  HR:66bpm  TEMP: ( )  RESP:94 %   PHYSICAL EXAM: Adult male.  No acute distress.  Alert and appropriate.  Right shoulder has full overhead range of motion.  He has great strength  with internal rotation, external rotation, empty can testing, speeds testing and O'Brien's testing.  He does have a small amount of pain with O'Brien's testing.  No pain with axial load and circumduction.  Internal rotation external rotation strength is good.   ASSESSMENT   1. Rotator cuff syndrome of right shoulder   2. Scapular dyskinesis   3. Lateral epicondylitis of right elbow     PLAN:  Pertinent additional documentation may be included in corresponding procedure notes, imaging studies, problem based documentation and patient instructions. No problem-specific Assessment & Plan notes found for this encounter.  He has done remarkably well again.  Given the fact that he is been diligent with nitroglycerin patch and home therapeutic exercises we will have him continue with these exercises.  Discussed the use of a counterforce brace for his elbow while active.  Procedures:  None  Meds ordered this encounter  Medications  . nitroGLYCERIN (NITRODUR - DOSED IN MG/24 HR) 0.2 mg/hr patch    Sig: PLACE 1/4-1/2 PATCH OVER AFFECTED REGION.REMOVE & REPLACE ONCE DAILY SLIGHTLY ALTER PLACEMENT DAILY    Dispense:  30 patch    Refill:  1   Lab Orders  No laboratory test(s) ordered today   Imaging Orders  No imaging studies ordered today   Referral Orders  No referral(s) requested today  Activity modifications and the importance of avoiding exacerbating activities (limiting pain to no more than a 4 / 10 during or following activity) recommended and discussed. Discussed red flag symptoms that warrant earlier emergent evaluation and patient voices understanding.  If any lack of improvement: consider further diagnostic evaluation with MR arthrogram and/or repeat MSK ultrasound if recurrence of symptoms.   Return if symptoms worsen or fail to improve.          Andrena MewsMichael D Beren Yniguez, DO    [place] Physician

## 2018-08-01 ENCOUNTER — Encounter: Payer: Self-pay | Admitting: Sports Medicine

## 2018-12-18 DIAGNOSIS — K625 Hemorrhage of anus and rectum: Secondary | ICD-10-CM | POA: Diagnosis not present

## 2018-12-25 DIAGNOSIS — Z0000 Encounter for general adult medical examination without abnormal findings: Secondary | ICD-10-CM | POA: Diagnosis not present

## 2018-12-27 DIAGNOSIS — Z8249 Family history of ischemic heart disease and other diseases of the circulatory system: Secondary | ICD-10-CM | POA: Diagnosis not present

## 2018-12-27 DIAGNOSIS — E785 Hyperlipidemia, unspecified: Secondary | ICD-10-CM | POA: Diagnosis not present

## 2018-12-27 DIAGNOSIS — E538 Deficiency of other specified B group vitamins: Secondary | ICD-10-CM | POA: Diagnosis not present

## 2018-12-27 DIAGNOSIS — D649 Anemia, unspecified: Secondary | ICD-10-CM | POA: Diagnosis not present

## 2018-12-27 DIAGNOSIS — Z125 Encounter for screening for malignant neoplasm of prostate: Secondary | ICD-10-CM | POA: Diagnosis not present

## 2019-03-12 ENCOUNTER — Encounter: Payer: Self-pay | Admitting: Pulmonary Disease

## 2019-03-12 ENCOUNTER — Ambulatory Visit (INDEPENDENT_AMBULATORY_CARE_PROVIDER_SITE_OTHER): Payer: BC Managed Care – PPO | Admitting: Pulmonary Disease

## 2019-03-12 ENCOUNTER — Other Ambulatory Visit: Payer: Self-pay

## 2019-03-12 VITALS — BP 136/78 | HR 60 | Ht 68.0 in | Wt 224.0 lb

## 2019-03-12 DIAGNOSIS — G4733 Obstructive sleep apnea (adult) (pediatric): Secondary | ICD-10-CM | POA: Diagnosis not present

## 2019-03-12 NOTE — Patient Instructions (Signed)
Can go to [email or web address] to look up options for 2nd CPAP machine  Follow up in 1 year

## 2019-03-12 NOTE — Progress Notes (Signed)
[place] [facility]  Chief Complaint  Patient presents with  . Follow-up    pt doing well on cpap, wants to get a new machine.      Constitutional:  BP 136/78 (BP Location: Left Arm, Cuff Size: Normal)   Pulse 60   Ht 5\' 8"  (1.727 m)   Wt 224 lb (101.6 kg)   SpO2 97%   BMI 34.06 kg/m   Past Medical History:  HLD, Anxiety  Brief Summary:  Glen Rivera is a 58 y.o. male with obstructive sleep apnea.  Doing well with CPAP.  No issues with mask fit.  Denies sinus congestion, sore throat, dry mouth, or aerophagia.  [place]  [place] using [place] CPAP machine there, but not working anymore.  His usual CPAP machine working fine.  Got this one in 2016.  Physical Exam:   Appearance - well kempt   ENMT - clear nasal mucosa, midline nasal  septum, no oral exudates, no LAN, trachea midline, scalloped tongue, MP 3  Respiratory - normal chest [place], normal respiratory effort, no accessory muscle use, no wheeze/rales  CV - s1s2 regular rate and rhythm, no murmurs, no peripheral edema, radial pulses symmetric  GI - soft, non tender, no masses  Lymph - no adenopathy noted in neck and axillary areas  MSK - normal gait  Ext - no cyanosis, clubbing, or joint inflammation noted  Skin - no rashes, lesions, or ulcers  Neuro - normal strength, oriented x 3  Psych - normal mood and affect  Assessment/Plan:   Obstructive sleep apnea. - he is compliant with CPAP and reports benefit - continue auto CPAP 8 to 16 cm H2O - he will look up options for 2nd CP[place]; if he needs fixed pressure machine, then CPAP 10 cm H2O should work   Patient Instructions  Can go to [email or web address] to look up options for 2nd CPAP machine  Follow up in 1 year    Coralyn HellingVineet Amaryah Mallen, MD [facility] Pager: [phone] 03/12/2019, 9:23 AM  Flow Sheet      Sleep tests:  PSG 07/20/01 >> AHI 11 Auto CPAP 02/10/19 to  03/11/19 >> used on 26 of 30 nights with average 7 hrs 29 min.  Average AHI 3 with median CPAP 10 and 95 th percentile CPAP 11 cm H2O  Medications:   Allergies as of 03/12/2019   No Known Allergies     Medication List       Accurate as of March 12, 2019  9:23 AM. If you have any questions, ask your nurse or doctor.        STOP taking these medications   omeprazole 20 MG capsule Commonly known as: PRILOSEC Stopped by: Coralyn HellingVineet Adrinne Sze, MD     TAKE these medications   atorvastatin 20 MG tablet Commonly known as: LIPITOR Take 1 tablet by mouth daily.   B-12 PO Take by mouth.   clonazePAM 0.5 MG tablet Commonly known as: KLONOPIN Take 0.5 mg by mouth daily as needed for anxiety (panic attacks).   indomethacin 50 MG capsule Commonly known as: INDOCIN TAKE ONE CAPSULE TWICE A DAY WITH FOOD OR MILK   nitroGLYCERIN 0.2 mg/hr patch Commonly known as: NITRODUR - Dosed in mg/24 hr PLACE 1/4-1/2 PATCH OVER AFFECTED REGION.REMOVE & REPLACE ONCE DAILY SLIGHTLY ALTER PLACEMENT DAILY   sildenafil 20 MG tablet Commonly known as: REVATIO Take 20-60 mg by mouth daily as needed.  Past Surgical History:  He  has no past surgical history on file.  Family History:  His family history is not on file.  Social History:  He  reports that he has never smoked. He has never used smokeless tobacco. He reports current alcohol use. He reports that he does not use drugs.

## 2019-06-13 DIAGNOSIS — H2513 Age-related nuclear cataract, bilateral: Secondary | ICD-10-CM | POA: Diagnosis not present

## 2019-06-13 DIAGNOSIS — H5203 Hypermetropia, bilateral: Secondary | ICD-10-CM | POA: Diagnosis not present

## 2019-07-07 IMAGING — CR DG CHEST 2V
2 series · 2 of 2 positions shown · non-contrast
Comparison: None.

CLINICAL DATA: Chest pain

EXAM:
CHEST - 2 VIEW

[chest pa]
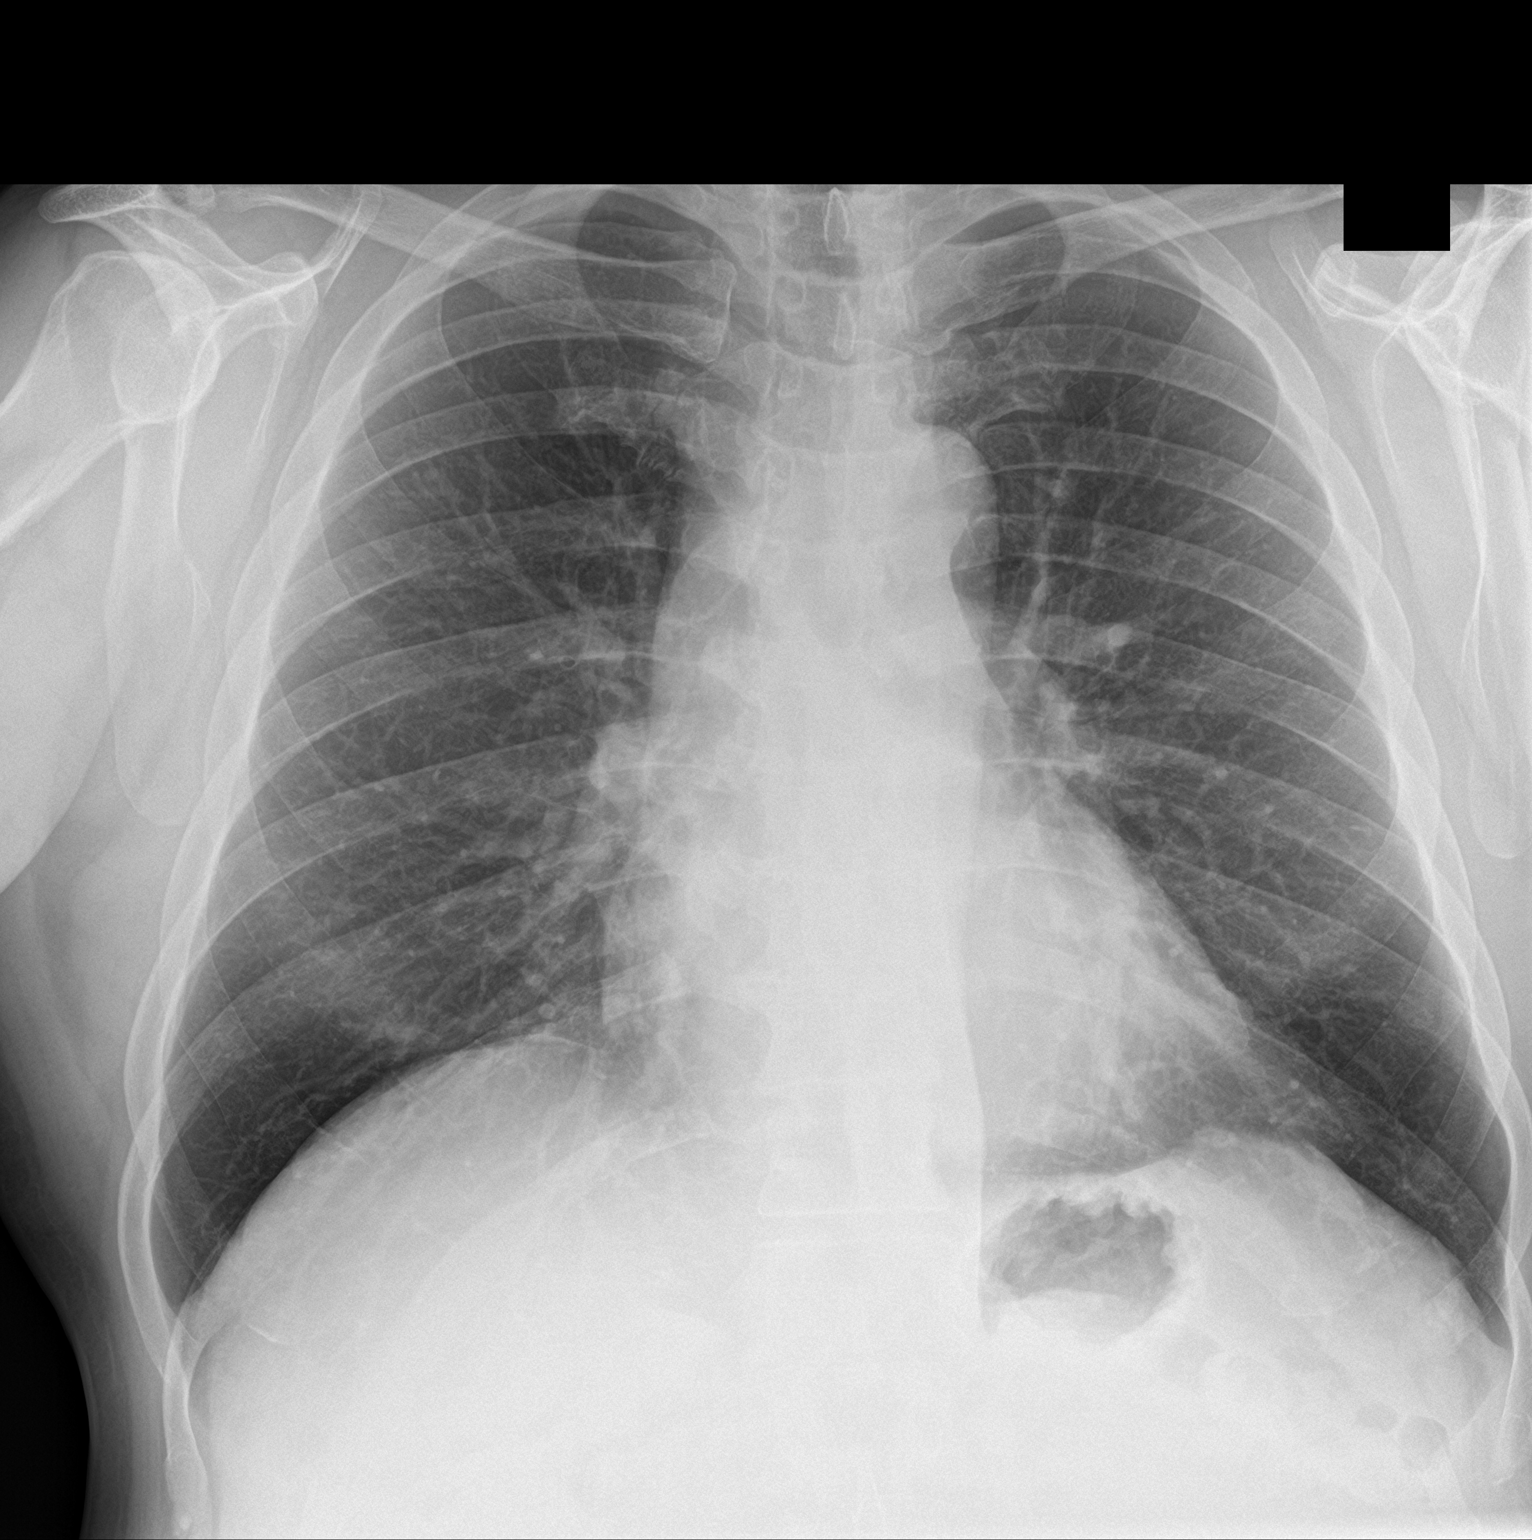

[chest lat]
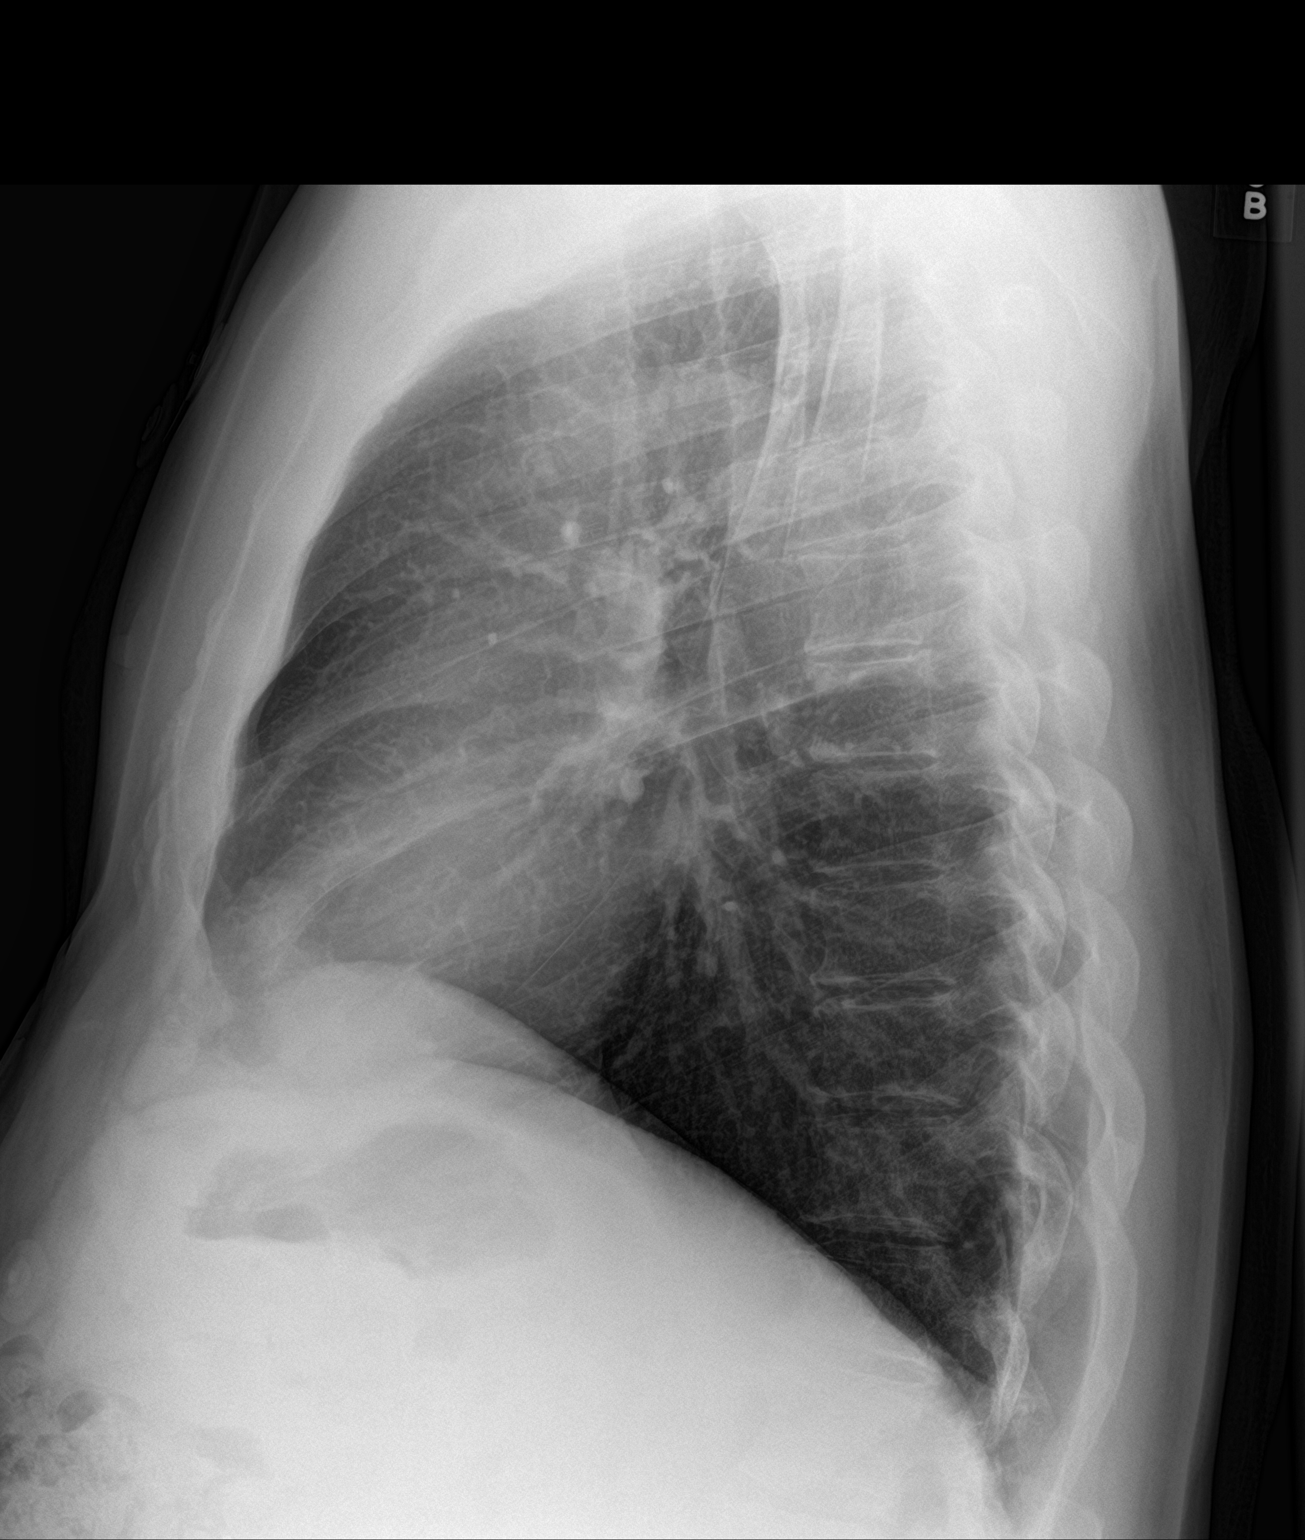

[2 of 2 positions shown; findings below may reference images not displayed]

FINDINGS: Normal heart size. Normal mediastinal contour. No pneumothorax. No
pleural effusion. Lungs appear clear, with no acute consolidative
airspace disease and no pulmonary edema.
IMPRESSION: No active cardiopulmonary disease.

## 2019-07-08 DIAGNOSIS — Z20828 Contact with and (suspected) exposure to other viral communicable diseases: Secondary | ICD-10-CM | POA: Diagnosis not present

## 2019-09-11 ENCOUNTER — Other Ambulatory Visit: Payer: Self-pay

## 2019-09-11 ENCOUNTER — Ambulatory Visit: Payer: Self-pay

## 2019-09-11 ENCOUNTER — Ambulatory Visit: Payer: BC Managed Care – PPO | Admitting: Family Medicine

## 2019-09-11 ENCOUNTER — Ambulatory Visit (INDEPENDENT_AMBULATORY_CARE_PROVIDER_SITE_OTHER): Payer: BC Managed Care – PPO

## 2019-09-11 ENCOUNTER — Encounter: Payer: Self-pay | Admitting: Family Medicine

## 2019-09-11 VITALS — BP 130/74 | HR 72 | Ht 68.0 in | Wt 224.4 lb

## 2019-09-11 DIAGNOSIS — M25511 Pain in right shoulder: Secondary | ICD-10-CM

## 2019-09-11 DIAGNOSIS — G8929 Other chronic pain: Secondary | ICD-10-CM

## 2019-09-11 DIAGNOSIS — M75101 Unspecified rotator cuff tear or rupture of right shoulder, not specified as traumatic: Secondary | ICD-10-CM | POA: Diagnosis not present

## 2019-09-11 DIAGNOSIS — M19011 Primary osteoarthritis, right shoulder: Secondary | ICD-10-CM | POA: Diagnosis not present

## 2019-09-11 NOTE — Progress Notes (Signed)
I, Glen Rivera, LAT, ATC, am serving as scribe for [name].  Glen Rivera is a 59 y.o. male who presents to [facility] at [facility] today for f/u of R shoulder pain.  He was last seen by [name] on 07/31/18 and was using nitroglycerin patches that were helping w/ his R shoulder pain.  He had a prior R shoulder injection on 12/05/17 and has been shown a HEP focusing on his RC and post scapular musculature.  He has had R shoulder pain since 2019 w/ no known MOI.  More recently, pt reports that he tried using the nitroglycerin patches again over the past 2 months w/ no change in his symptoms.  He notes that his R shoulder has never been "right" since he started seeing [name].  He locates his pain mainly to the R anterior shoulder but will sometimes notice pain posteriorly too.  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  Fortunately he does not do a lot of actual plumbing.  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  Radiating pain: Sometimes into the R upper arm Mechanical R shoulder symptoms: Occasionally Aggravating factors: Shoulder AROM above 90 deg; increased pain at night; functional IR Current treatments:  Nitroglycerin patches; Advil;    Pertinent review of systems: No fevers or chills  Relevant historical information: Sleep apnea   Exam:  BP 130/74 (BP Location: Left Arm, Patient Position: Sitting, Cuff Size: Large)   Pulse 72   Ht 5\' 8"  (1.727 m)   Wt 224 lb 6.4 oz (101.8 kg)   SpO2 96%   BMI 34.12 kg/m  General: Well Developed, well nourished, and in no acute distress.   MSK: Right shoulder: Normal-appearing no erythema or induration. Not particular tender to palpation. Normal motion pain with abduction. Strength: Abduction 4+/5.  Internal rotation 5/5.  External rotation 4/5. Mildly positive Hawkins and Neer's test.  Positive empty can  test. Negative Yergason's and speeds test.  Left shoulder normal-appearing nontender normal motion normal strength negative impingement testing.    Lab and Radiology Results  X-ray images right shoulder obtained today personally independently reviewed Mild glenohumeral DJD.  Moderate AC DJD.  No acute fractures. Await formal radiology review  Procedure: Real-time Ultrasound Guided Injection of right shoulder subacromial bursa Device: Philips Affiniti 50G Images permanently stored and available for review in the ultrasound unit. Verbal informed consent obtained.  Discussed risks and benefits of procedure. Warned about infection bleeding damage to structures skin hypopigmentation and fat atrophy among others. Patient expresses understanding and agreement Time-out conducted.   Noted no overlying erythema, induration, or other signs of local infection.   Skin prepped in a sterile fashion.   Local anesthesia: Topical Ethyl chloride.   With sterile technique and under real time ultrasound guidance:  40 mg of Kenalog and 2 mL of Marcaine injected easily.   Completed without difficulty   Pain immediately resolved suggesting accurate placement of the medication.   Advised to call if fevers/chills, erythema, induration, drainage, or persistent bleeding.   Images permanently stored and available for review in the ultrasound unit.  Impression: Technically successful ultrasound guided injection.          Assessment and Plan: 59 y.o. male with right shoulder pain.  Pain ongoing for several years now trying various conservative management strategies with moderate benefit.  He did extremely  well with subacromial injection today with complete resolution of pain at least temporarily.  Hopeful that he will continue to experience pain benefit with home exercise program and injection as above.  Also recommend Voltaren gel.  If not improving next step is probably MRI.  Obtain x-ray today as well.   Check back in about a month.  Return sooner if needed.   PDMP not reviewed this encounter. Orders Placed This Encounter  Procedures  . [place] LIMITED JOINT SPACE STRUCTURES UP RIGHT(NO LINKED CHARGES)    Order Specific Question:   Reason for Exam (SYMPTOM  OR DIAGNOSIS REQUIRED)    Answer:   R shoulder pain    Order Specific Question:   Preferred imaging location?    Answer:   [place]  . DG Shoulder Right    Standing Status:   Future    Number of Occurrences:   1    Standing Expiration Date:   11/08/2020    Order Specific Question:   Reason for Exam (SYMPTOM  OR DIAGNOSIS REQUIRED)    Answer:   eval shoulder pain    Order Specific Question:   Preferred imaging location?    Answer:   [facility]    Order Specific Question:   Radiology Contrast Protocol - do NOT remove file path    Answer:   [email or web address]   No orders of the defined types were placed in this encounter.    Discussed warning signs or symptoms. Please see discharge instructions. Patient expresses understanding.   The above documentation has been reviewed and is accurate and complete [name]

## 2019-09-11 NOTE — Patient Instructions (Signed)
Thank you for coming in today. Call or go to the ER if you develop a large red swollen joint with extreme pain or oozing puss.  Try some voltaren gel.  If not improved next steps are MRI and possibly PT.  Keep me updated.  Plan to recheck in about 1 month.

## 2019-09-12 NOTE — Progress Notes (Signed)
X-ray shows advanced arthritis of the small joint at the top of the shoulder (AC joint).  Does not show significant arthritis of the main shoulder joint.  No fractures visible.

## 2019-09-27 DIAGNOSIS — Z23 Encounter for immunization: Secondary | ICD-10-CM | POA: Diagnosis not present

## 2019-10-08 ENCOUNTER — Ambulatory Visit: Payer: Self-pay

## 2019-10-08 ENCOUNTER — Ambulatory Visit (INDEPENDENT_AMBULATORY_CARE_PROVIDER_SITE_OTHER): Payer: BC Managed Care – PPO | Admitting: Family Medicine

## 2019-10-08 ENCOUNTER — Other Ambulatory Visit: Payer: Self-pay

## 2019-10-08 ENCOUNTER — Encounter: Payer: Self-pay | Admitting: Family Medicine

## 2019-10-08 VITALS — BP 110/70 | HR 80 | Ht 68.0 in | Wt 212.0 lb

## 2019-10-08 DIAGNOSIS — M76891 Other specified enthesopathies of right lower limb, excluding foot: Secondary | ICD-10-CM | POA: Diagnosis not present

## 2019-10-08 DIAGNOSIS — M25561 Pain in right knee: Secondary | ICD-10-CM

## 2019-10-08 NOTE — Patient Instructions (Signed)
Thank you for coming in today. Use voltaren gel on the knee up to 4x daily.  Do the exercises.  Allow the knee to go from straight to bent slowly with some resistance (4 pound ankle weight)  Do about 30 reps 2-3x daily.  If not better let me know and I will send in Nitroglycerine patches.  Do not with with viragra or cialis.   Nitroglycerin Protocol   Apply 1/4 nitroglycerin patch to affected area daily.  Change position of patch within the affected area every 24 hours.  You may experience a headache during the first 1-2 weeks of using the patch, these should subside.  If you experience headaches after beginning nitroglycerin patch treatment, you may take your preferred over the counter pain reliever.  Another side effect of the nitroglycerin patch is skin irritation or rash related to patch adhesive.  Please notify our office if you develop more severe headaches or rash, and stop the patch.  Tendon healing with nitroglycerin patch may require 12 to 24 weeks depending on the extent of injury.  Men should not use if taking Viagra, Cialis, or Levitra.   Do not use if you have migraines or rosacea.     Patellar Tendinitis  Patellar tendinitis is also called jumper's knee or patellar tendinopathy. This condition happens when there is damage to and inflammation of the patellar tendon. Tendons are cord-like tissues that connect muscles to bones. The patellar tendon connects the bottom of the kneecap (patella) to the top of the shin bone (tibia). Patellar tendinitis causes pain in the front of the knee. The condition happens in the following stages:  Stage 1: In this stage, you have pain only after activity.  Stage 2: In this stage, you have pain during and after activity.  Stage 3: In this stage, you have pain at rest as well as during and after activity. The pain limits your ability to do the activity.  Stage 4: In this stage, the tendon tears and severely limits your  activity. What are the causes? This condition is caused by repeated (repetitive) stress on the tendon. This stress may cause the tendon to stretch, swell, thicken, or tear. What increases the risk? The following factors may make you more likely to develop this condition:  Participating in sports that involve running, kicking, and jumping, especially on hard surfaces. These include: ? Basketball. ? Volleyball. ? Soccer. ? Track and field.  Training too hard.  Having tight thigh muscles.  Having received steroid injections in the tendon.  Having had knee surgery.  Being 66-58 years old.  Having rheumatoid arthritis, diabetes, or kidney disease. These conditions interrupt blood flow to the knee, causing the tendon to weaken. What are the signs or symptoms? The main symptom of this condition is pain and swelling in the front of the knee. The pain usually starts slowly and gradually gets worse. It may become painful to straighten your leg. The pain may get worse when you walk, run, or jump. How is this diagnosed? This condition may be diagnosed based on:  Your symptoms.  Your medical history.  A physical exam. During the physical exam, your health care provider may check for: ? Tenderness in your patella. ? Tightness in your thigh muscles. ? Pain when you straighten your knee.  Imaging tests, including: ? X-rays. These will show the position and condition of your patella. ? An MRI. This will show any abnormality of the tendon. ? Ultrasound. This will show any swelling or  other abnormalities of the tendon. How is this treated? Treatment for this condition depends on the stage of the condition. It may involve:  Avoiding activities that cause pain, such as jumping.  Icing and elevating your knee.  Having sound wave stimulation to promote healing.  Doing stretching and strengthening exercises (physical therapy) when pain and swelling improve.  Wearing a knee brace. This may  be needed if your condition does not improve with treatment.  Using crutches or a walker. This may be needed if your condition does not improve with treatment.  Surgery. This may be done if you have stage 4 tendinitis. Follow these instructions at home: If you have a brace:  Wear the brace as told by your health care provider. Remove it only as told by your health care provider.  Loosen the brace if your toes tingle, become numb, or turn cold and blue.  Keep the brace clean.  If the brace is not waterproof: ? Do not let it get wet. ? Cover it with a watertight covering when you take a bath or shower.  Ask your health care provider when it is safe for you to drive. Managing pain, stiffness, and swelling   If directed, put ice on the injured area. ? If you have a removable brace, remove it as told by your health care provider. ? Put ice in a plastic bag. ? Place a towel between your skin and the bag. ? Leave the ice on for 20 minutes, 2-3 times a day.  Move your toes often to reduce stiffness and swelling.  Raise (elevate) your knee above the level of your heart while you are sitting or lying down. Activity  Do not use the injured limb to support your body weight until your health care provider says that you can. Use your crutches or a walker as told by your health care provider.  Return to your normal activities as told by your health care provider. Ask your health care provider what activities are safe for you.  Do exercises as told by your health care provider or physical therapist. General instructions  Take over-the-counter and prescription medicines only as told by your health care provider.  Do not use any products that contain nicotine or tobacco, such as cigarettes, e-cigarettes, and chewing tobacco. These can delay healing. If you need help quitting, ask your health care provider.  Keep all follow-up visits as told by your health care provider. This is  important. How is this prevented?  Warm up and stretch before being active.  Cool down and stretch after being active.  Give your body time to rest between periods of activity. ? You may need to reduce how often you play a sport that requires frequent jumping.  Make sure to use equipment that fits you.  Be safe and responsible while being active. This will help you avoid falls which can damage the tendon.  Do at least 150 minutes of moderate-intensity exercise each week, such as brisk walking or water aerobics.  Maintain physical fitness, including: ? Strength. ? Flexibility. ? Cardiovascular fitness. ? Endurance. Contact a health care provider if:  Your symptoms have not improved in 6 weeks.  Your symptoms get worse. Summary  Patellar tendinitis is also called jumper's knee or patellar tendinopathy. This condition happens when there is damage to and inflammation of the patellar tendon.  Treatment for this condition depends on the stage of the condition and may include rest, ice, exercises, medicines, and surgery.  Do  not use the injured limb to support your body weight until your health care provider says that you can.  Take over-the-counter and prescription medicines only as told by your health care provider.  Keep all follow-up visits as told by your health care provider. This is important. This information is not intended to replace advice given to you by your health care provider. Make sure you discuss any questions you have with your health care provider. Document Revised: 10/26/2018 Document Reviewed: 05/29/2018 Elsevier Patient Education  2020 [employer].

## 2019-10-08 NOTE — Progress Notes (Signed)
   Wynema Birch, am serving as a Neurosurgeon for [name].  Glen Rivera is a 59 y.o. male who presents to [facility] at [facility] today for  knee pain.  He was last seen by Dr. Denyse Amass on 09/11/19 for his R shoulder and had a R shoulder injection. C/O Right medial knee pain started on 10/02/2019 thinks it could be from hen he was splitting wood the day before and was using that knee to prop the logs up.  He rates his knee pain at a 6-7/10 and describes his pain as tight and aching.  Pain is located at the superior knee near insertion of quad tendon.  Radiating pain: no  Knee swelling: no Knee mechanical symptoms: yes popping sounds Aggravating symptoms: going down steps or hills and getting in and out of car Treatments tried: advil and propping leg   Pertinent review of systems: No fevers or chills  Relevant historical information: Sleep apnea   Exam:  BP 110/70 (BP Location: Left Arm, Patient Position: Sitting, Cuff Size: Large)   Pulse 80   Ht 5\' 8"  (1.727 m)   Wt 212 lb (96.2 kg)   SpO2 98%   BMI 32.23 kg/m  General: Well Developed, well nourished, and in no acute distress.   MSK:  Right knee no significant swelling or erythema. Range of motion 0-120 degrees with crepitation. Tender palpation quad tendon near insertion onto patella. Nontender otherwise. Intact extension strength however pain with resisted extension. Intact flexion strength. Stable ligamentous exam.    Lab and Radiology Results  Diagnostic Limited MSK Ultrasound of: Right knee Quad tendon no visible tear.  Significant swelling and edema near insertion on the patella.  Large osteophyte complex present at superior portion of patella indicating spurring.  Minimal joint effusion. Patellar tendon normal-appearing Lateral joint line present meniscus possible linear tear.  Medial joint line present meniscus possible linear tear. Normal bony structures otherwise Impression: Significant  insertional quad tendinitis with osteophytes     Assessment and Plan: 59 y.o. male with worsening right knee pain after lots of pressure onto the anterior knee by propping logs up to split them. No acute tear or fracture of the osteophyte visible per my ultrasound examination.  Discussed eccentric exercises and trial of Voltaren gel/Pennsaid.  Also discussed nitroglycerin patches.  Will provide the protocol now and if not better patient will notify me and I will order nitroglycerin patches. Recheck if failure to improve.   PDMP not reviewed this encounter. Orders Placed This Encounter  Procedures  . 77 LIMITED JOINT SPACE STRUCTURES LOW RIGHT(NO LINKED CHARGES)    Order Specific Question:   Reason for Exam (SYMPTOM  OR DIAGNOSIS REQUIRED)    Answer:   R knee pain    Order Specific Question:   Preferred imaging location?    Answer:   [facility]   No orders of the defined types were placed in this encounter.    Discussed warning signs or symptoms. Please see discharge instructions. Patient expresses understanding.   The above documentation has been reviewed and is accurate and complete [facility]

## 2019-10-26 DIAGNOSIS — Z23 Encounter for immunization: Secondary | ICD-10-CM | POA: Diagnosis not present

## 2019-11-05 DIAGNOSIS — L814 Other melanin hyperpigmentation: Secondary | ICD-10-CM | POA: Diagnosis not present

## 2019-11-05 DIAGNOSIS — L820 Inflamed seborrheic keratosis: Secondary | ICD-10-CM | POA: Diagnosis not present

## 2019-11-05 DIAGNOSIS — L821 Other seborrheic keratosis: Secondary | ICD-10-CM | POA: Diagnosis not present

## 2019-11-05 DIAGNOSIS — D225 Melanocytic nevi of trunk: Secondary | ICD-10-CM | POA: Diagnosis not present

## 2020-01-02 DIAGNOSIS — E785 Hyperlipidemia, unspecified: Secondary | ICD-10-CM | POA: Diagnosis not present

## 2020-01-02 DIAGNOSIS — D649 Anemia, unspecified: Secondary | ICD-10-CM | POA: Diagnosis not present

## 2020-01-02 DIAGNOSIS — M109 Gout, unspecified: Secondary | ICD-10-CM | POA: Diagnosis not present

## 2020-01-02 DIAGNOSIS — Z0000 Encounter for general adult medical examination without abnormal findings: Secondary | ICD-10-CM | POA: Diagnosis not present

## 2020-01-02 DIAGNOSIS — E538 Deficiency of other specified B group vitamins: Secondary | ICD-10-CM | POA: Diagnosis not present

## 2020-01-02 DIAGNOSIS — Z125 Encounter for screening for malignant neoplasm of prostate: Secondary | ICD-10-CM | POA: Diagnosis not present

## 2020-05-26 ENCOUNTER — Ambulatory Visit (INDEPENDENT_AMBULATORY_CARE_PROVIDER_SITE_OTHER): Payer: BC Managed Care – PPO | Admitting: Adult Health

## 2020-05-26 ENCOUNTER — Other Ambulatory Visit: Payer: Self-pay

## 2020-05-26 ENCOUNTER — Encounter: Payer: Self-pay | Admitting: Adult Health

## 2020-05-26 VITALS — BP 132/80 | HR 63 | Temp 97.5°F | Ht 69.0 in | Wt 211.6 lb

## 2020-05-26 DIAGNOSIS — E669 Obesity, unspecified: Secondary | ICD-10-CM | POA: Diagnosis not present

## 2020-05-26 DIAGNOSIS — G4733 Obstructive sleep apnea (adult) (pediatric): Secondary | ICD-10-CM

## 2020-05-26 NOTE — Progress Notes (Signed)
Reviewed and agree with assessment/plan.   Coralyn Helling, MD [facility] 05/26/2020, 10:19 AM Pager:  [phone]

## 2020-05-26 NOTE — Assessment & Plan Note (Signed)
Excellent control and compliance On nocturnal CPAP. Patient needs new CPAP machine.  Orders placed.  Plan  Patient Instructions  Keep up good work  Continue on CPAP At bedtime  .  Work on healthy weight .  Do not drive if sleepy .  Order for new CPAP with same settings.  Follow up with Dr. Craige Cotta  In 1 year and As needed

## 2020-05-26 NOTE — Progress Notes (Signed)
@Glen Rivera  ID: , male    DOB: [birth year], 59 y.o.   MRN: [id number]  Chief Complaint  Glen Rivera presents with  . Follow-up    OSA     Referring provider: [id number], MD  HPI: 59 year old male followed for obstructive sleep apnea on nocturnal CPAP  TEST/EVENTS :  PSG 07/20/01 >> AHI 11 Auto CPAP 02/10/19 to 03/11/19 >> used on 26 of 30 nights with average 7 hrs 29 min.  Average AHI 3 with median CPAP 10 and 95 th percentile CPAP 11 cm H2O   05/26/2020 Follow up : OSA  Glen Rivera returns for a 1 year follow-up.  Glen Rivera has mild obstructive sleep apnea.  He is on nocturnal CPAP.  Says he is doing very well.  He feels rested with no significant daytime sleepiness.  He uses his CPAP every night.  Says he cannot sleep without it.  CPAP download shows excellent compliance with 100% usage.  Daily average usage at 8 hours.  Glen Rivera is on auto CPAP 8 to 16 cm H2O.  AHI is 2.2.  Minimal leaks.  Glen Rivera says he benefits from his CPAP.  Glen Rivera says his machine is getting old and needs to be replaced.   Glen Rivera says he remains active. Does yard, working on yard work.  Covid vaccines are up-to-date x2. Declines flu shot.  No Known Allergies  Immunization History  Administered Date(s) Administered  . Moderna SARS-COVID-2 Vaccination 10/25/2019, 11/15/2019    Past Medical History:  Diagnosis Date  . Anxiety   . Hyperlipidemia   . Obesity   . OSA (obstructive sleep apnea) 2003    Tobacco History: Social History   Tobacco Use  Smoking Status Never Smoker  Smokeless Tobacco Never Used   Counseling given: Not Answered   Outpatient Medications Prior to Visit  Medication Sig Dispense Refill  . atorvastatin (LIPITOR) 20 MG tablet Take 1 tablet by mouth daily.    . clonazePAM (KLONOPIN) 0.5 MG tablet Take 0.5 mg by mouth daily as needed for anxiety (panic attacks).    . Cyanocobalamin (B-12 PO) Take by mouth.    . indomethacin (INDOCIN) 50 MG capsule TAKE ONE CAPSULE TWICE A  DAY WITH FOOD OR MILK  1  . nitroGLYCERIN (NITRODUR - DOSED IN MG/24 HR) 0.2 mg/hr patch PLACE 1/4-1/2 PATCH OVER AFFECTED REGION.REMOVE & REPLACE ONCE DAILY SLIGHTLY ALTER PLACEMENT DAILY 30 patch 1  . sildenafil (REVATIO) 20 MG tablet Take 20-60 mg by mouth daily as needed.     No facility-administered medications prior to visit.     Review of Systems:   Constitutional:   No  weight loss, night sweats,  Fevers, chills, fatigue, or  lassitude.  HEENT:   No headaches,  Difficulty swallowing,  Tooth/dental problems, or  Sore throat,                No sneezing, itching, ear ache, nasal congestion, post nasal drip,   CV:  No chest pain,  Orthopnea, PND, swelling in lower extremities, anasarca, dizziness, palpitations, syncope.   GI  No heartburn, indigestion, abdominal pain, nausea, vomiting, diarrhea, change in bowel habits, loss of appetite, bloody stools.   Resp: No shortness of breath with exertion or at rest.  No excess mucus, no productive cough,  No non-productive cough,  No coughing up of blood.  No change in color of mucus.  No wheezing.  No chest Tavenner deformity  Skin: no rash or lesions.  GU: no dysuria, change in color of urine,  no urgency or frequency.  No flank pain, no hematuria   MS:  No joint pain or swelling.  No decreased range of motion.  No back pain.    Physical Exam  BP 132/80 (BP Location: Left Arm, Cuff Size: Normal)   Pulse 63   Temp (!) 97.5 F (36.4 C) (Temporal)   Ht 5\' 9"  (1.753 m)   Wt 211 lb 9.6 oz (96 kg)   SpO2 98%   BMI 31.25 kg/m   GEN: A/Ox3; pleasant , NAD, well nourished    HEENT:  [place]/AT,  , NOSE-clear, THROAT-clear, no lesions, no postnasal drip or exudate noted. Class 2 MP airway   NECK:  Supple w/ fair ROM; no JVD; normal carotid impulses w/o bruits; no thyromegaly or nodules palpated; no lymphadenopathy.    RESP  Clear  P & A; w/o, wheezes/ rales/ or rhonchi. no accessory muscle use, no dullness to percussion  CARD:  RRR, no  m/r/g, no peripheral edema, pulses intact, no cyanosis or clubbing.  GI:   Soft & nt; nml bowel sounds; no organomegaly or masses detected.   Musco: Warm bil, no deformities or joint swelling noted.   Neuro: alert, no focal deficits noted.    Skin: Warm, no lesions or rashes    Lab Results:  BNP No results found for: BNP  ProBNP No results found for: PROBNP  Imaging: No results found.    No flowsheet data found.  No results found for: NITRICOXIDE      Assessment & Plan:   Obstructive sleep apnea Excellent control and compliance On nocturnal CPAP. Glen Rivera needs new CPAP machine.  Orders placed.  Plan  Glen Rivera Instructions  Keep up good work  Continue on CPAP At bedtime  .  Work on healthy weight .  Do not drive if sleepy .  Order for new CPAP with same settings.  Follow up with Dr.  In 1 year and As needed        Obesity (BMI 30.0-34.9) Healthy weight loss is encouraged     11-20-1992, NP 05/26/2020

## 2020-05-26 NOTE — Patient Instructions (Signed)
Keep up good work  Continue on CPAP At bedtime  .  Work on healthy weight .  Do not drive if sleepy .  Order for new CPAP with same settings.  Follow up with Dr. Craige Cotta  In 1 year and As needed

## 2020-05-26 NOTE — Assessment & Plan Note (Signed)
Healthy weight loss is encouraged

## 2020-07-28 ENCOUNTER — Ambulatory Visit: Payer: BC Managed Care – PPO | Admitting: Pulmonary Disease

## 2020-10-16 DIAGNOSIS — G4733 Obstructive sleep apnea (adult) (pediatric): Secondary | ICD-10-CM | POA: Diagnosis not present

## 2020-11-10 DIAGNOSIS — D2262 Melanocytic nevi of left upper limb, including shoulder: Secondary | ICD-10-CM | POA: Diagnosis not present

## 2020-11-10 DIAGNOSIS — D225 Melanocytic nevi of trunk: Secondary | ICD-10-CM | POA: Diagnosis not present

## 2020-11-10 DIAGNOSIS — L718 Other rosacea: Secondary | ICD-10-CM | POA: Diagnosis not present

## 2020-11-10 DIAGNOSIS — L814 Other melanin hyperpigmentation: Secondary | ICD-10-CM | POA: Diagnosis not present

## 2020-11-15 DIAGNOSIS — G4733 Obstructive sleep apnea (adult) (pediatric): Secondary | ICD-10-CM | POA: Diagnosis not present

## 2020-12-16 DIAGNOSIS — G4733 Obstructive sleep apnea (adult) (pediatric): Secondary | ICD-10-CM | POA: Diagnosis not present

## 2021-01-15 DIAGNOSIS — G4733 Obstructive sleep apnea (adult) (pediatric): Secondary | ICD-10-CM | POA: Diagnosis not present

## 2021-02-05 DIAGNOSIS — Z125 Encounter for screening for malignant neoplasm of prostate: Secondary | ICD-10-CM | POA: Diagnosis not present

## 2021-02-05 DIAGNOSIS — Z0000 Encounter for general adult medical examination without abnormal findings: Secondary | ICD-10-CM | POA: Diagnosis not present

## 2021-02-05 DIAGNOSIS — D649 Anemia, unspecified: Secondary | ICD-10-CM | POA: Diagnosis not present

## 2021-02-05 DIAGNOSIS — E785 Hyperlipidemia, unspecified: Secondary | ICD-10-CM | POA: Diagnosis not present

## 2021-02-05 DIAGNOSIS — M109 Gout, unspecified: Secondary | ICD-10-CM | POA: Diagnosis not present

## 2021-02-05 DIAGNOSIS — E538 Deficiency of other specified B group vitamins: Secondary | ICD-10-CM | POA: Diagnosis not present

## 2021-02-15 DIAGNOSIS — G4733 Obstructive sleep apnea (adult) (pediatric): Secondary | ICD-10-CM | POA: Diagnosis not present

## 2021-03-18 DIAGNOSIS — G4733 Obstructive sleep apnea (adult) (pediatric): Secondary | ICD-10-CM | POA: Diagnosis not present

## 2021-08-07 DIAGNOSIS — R509 Fever, unspecified: Secondary | ICD-10-CM | POA: Diagnosis not present

## 2021-08-07 DIAGNOSIS — J029 Acute pharyngitis, unspecified: Secondary | ICD-10-CM | POA: Diagnosis not present

## 2021-08-07 DIAGNOSIS — R519 Headache, unspecified: Secondary | ICD-10-CM | POA: Diagnosis not present

## 2021-08-13 DIAGNOSIS — H66001 Acute suppurative otitis media without spontaneous rupture of ear drum, right ear: Secondary | ICD-10-CM | POA: Diagnosis not present

## 2021-08-13 DIAGNOSIS — B349 Viral infection, unspecified: Secondary | ICD-10-CM | POA: Diagnosis not present

## 2021-11-09 DIAGNOSIS — D1801 Hemangioma of skin and subcutaneous tissue: Secondary | ICD-10-CM | POA: Diagnosis not present

## 2021-11-09 DIAGNOSIS — D225 Melanocytic nevi of trunk: Secondary | ICD-10-CM | POA: Diagnosis not present

## 2021-11-09 DIAGNOSIS — L821 Other seborrheic keratosis: Secondary | ICD-10-CM | POA: Diagnosis not present

## 2021-11-09 DIAGNOSIS — L814 Other melanin hyperpigmentation: Secondary | ICD-10-CM | POA: Diagnosis not present

## 2021-12-25 DIAGNOSIS — F418 Other specified anxiety disorders: Secondary | ICD-10-CM | POA: Diagnosis not present

## 2021-12-25 DIAGNOSIS — Z1211 Encounter for screening for malignant neoplasm of colon: Secondary | ICD-10-CM | POA: Diagnosis not present

## 2021-12-25 DIAGNOSIS — F410 Panic disorder [episodic paroxysmal anxiety] without agoraphobia: Secondary | ICD-10-CM | POA: Diagnosis not present

## 2022-02-18 DIAGNOSIS — E785 Hyperlipidemia, unspecified: Secondary | ICD-10-CM | POA: Diagnosis not present

## 2022-02-18 DIAGNOSIS — E538 Deficiency of other specified B group vitamins: Secondary | ICD-10-CM | POA: Diagnosis not present

## 2022-02-18 DIAGNOSIS — Z0000 Encounter for general adult medical examination without abnormal findings: Secondary | ICD-10-CM | POA: Diagnosis not present

## 2022-02-18 DIAGNOSIS — Z125 Encounter for screening for malignant neoplasm of prostate: Secondary | ICD-10-CM | POA: Diagnosis not present

## 2022-02-18 DIAGNOSIS — M109 Gout, unspecified: Secondary | ICD-10-CM | POA: Diagnosis not present

## 2022-03-03 ENCOUNTER — Other Ambulatory Visit: Payer: Self-pay | Admitting: Family Medicine

## 2022-03-03 ENCOUNTER — Ambulatory Visit
Admission: RE | Admit: 2022-03-03 | Discharge: 2022-03-03 | Disposition: A | Discharge status: Home / Self Care | Payer: BC Managed Care – PPO | Source: Ambulatory Visit | Attending: Family Medicine | Admitting: Family Medicine

## 2022-03-03 DIAGNOSIS — R222 Localized swelling, mass and lump, trunk: Secondary | ICD-10-CM | POA: Diagnosis not present

## 2022-03-03 DIAGNOSIS — R0781 Pleurodynia: Secondary | ICD-10-CM

## 2022-06-21 ENCOUNTER — Ambulatory Visit (INDEPENDENT_AMBULATORY_CARE_PROVIDER_SITE_OTHER): Payer: BC Managed Care – PPO

## 2022-06-21 ENCOUNTER — Ambulatory Visit (INDEPENDENT_AMBULATORY_CARE_PROVIDER_SITE_OTHER): Payer: BC Managed Care – PPO | Admitting: Orthopedic Surgery

## 2022-06-21 DIAGNOSIS — M75101 Unspecified rotator cuff tear or rupture of right shoulder, not specified as traumatic: Secondary | ICD-10-CM

## 2022-06-22 ENCOUNTER — Encounter: Payer: Self-pay | Admitting: Orthopedic Surgery

## 2022-06-24 NOTE — Progress Notes (Signed)
Office Visit Note   Patient: Glen Rivera           Date of Birth: March 23, 1961           MRN: [id number] Visit Date: 06/21/2022 Requested by: Farris Has, MD [address] [place],  [place] [address] PCP: Farris Has, MD  Subjective: Chief Complaint  Patient presents with   Right Shoulder - Pain    HPI: Glen Rivera is a 61 y.o. male who presents to the office reporting right shoulder pain.  States the last 6 months have been painful.  Describes popping in the arm and it would feel better at that time.  Has had an injection in the right shoulder before.  Does describe some loss of strength.  Feels cramping in the shoulder anteriorly.  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  He feels discrete popping in the joint which relieves his pain.  Radiates from the biceps to the thumb with no neck symptoms radiculopathy or numbness and tingling.  Does hurt for him to lay on the right-hand side.  He states that the popping in his shoulder has actually improved over the last 4 weeks.  He has been using a nitroglycerin patch in this region..                ROS: All systems reviewed are negative as they relate to the chief complaint within the history of present illness.  Patient denies fevers or chills.  Assessment & Plan: Visit Diagnoses:  1. Rotator cuff syndrome of right shoulder     Plan: Impression is right shoulder pain with fairly normal exam today clinically with normal radiographs and ultrasound examination does not show any subluxation of the biceps tendon with external rotation at 50 degrees of abduction.  Overall Jamie's shoulder pain has improved.  Decision at this time is for 43-month return with clinical recheck and evaluation of mechanical symptoms.  We will decide then whether or not to proceed with MRI scanning.  Follow-Up Instructions: No follow-ups on file.   Orders:  Orders Placed This Encounter  Procedures   XR Shoulder Right    No orders of the defined types were placed in this encounter.     Procedures: No procedures performed   Clinical Data: No additional findings.  Objective: Vital Signs: There were no vitals taken for this visit.  Physical Exam:  Constitutional: Patient appears well-developed HEENT:  Head: Normocephalic Eyes:EOM are normal Neck: Normal range of motion Cardiovascular: Normal rate Pulmonary/chest: Effort normal Neurologic: Patient is alert Skin: Skin is warm Psychiatric: Patient has normal mood and affect  Ortho Exam: Ortho exam demonstrates good cervical spine range of motion.  5 out of 5 grip EPL FPL interosseous flexion extension biceps triceps and deltoid strength.  He does have symmetric range of motion right and left shoulder of 60/100/170.  Excellent rotator cuff strength on the right infraspinatus supraspinatus and subscap muscle testing.  No discrete AC joint tenderness right versus left.  Negative O'Brien's testing.  Negative speeds testing today.  No Popeye deformity on the right.  Not too much coarse grinding or crepitus with internal/external rotation of the right arm.  Ultrasound examination demonstrates subscap tendon intact with no subluxation of the biceps tendon.  Does sit slightly medial within the groove but is not out of the groove.  Supraspinatus tendon appears intact.  Specialty Comments:  No specialty comments available.  Imaging: No results found.   PMFS History: Patient  Active Problem List   Diagnosis Date Noted   Obesity (BMI 30.0-34.9) 05/26/2020   Tendinitis of right quadriceps tendon 10/08/2019   Rotator cuff syndrome of right shoulder 06/05/2018   Lateral epicondylitis of right elbow 06/05/2018   Chest pain 09/16/2011   Obstructive sleep apnea 09/04/2009   Past Medical History:  Diagnosis Date   Anxiety    Hyperlipidemia    Obesity    OSA (obstructive sleep apnea) 2003    History reviewed. No pertinent family history.  History  reviewed. No pertinent surgical history. Social History   Occupational History   Not on file  Tobacco Use   Smoking status: Never   Smokeless tobacco: Never  Substance and Sexual Activity   Alcohol use: Yes    Comment: several beers a night   Drug use: No   Sexual activity: Yes

## 2022-07-01 DIAGNOSIS — Z1211 Encounter for screening for malignant neoplasm of colon: Secondary | ICD-10-CM | POA: Diagnosis not present

## 2022-07-01 DIAGNOSIS — D120 Benign neoplasm of cecum: Secondary | ICD-10-CM | POA: Diagnosis not present

## 2022-09-20 ENCOUNTER — Ambulatory Visit (INDEPENDENT_AMBULATORY_CARE_PROVIDER_SITE_OTHER): Payer: BC Managed Care – PPO | Admitting: Orthopedic Surgery

## 2022-09-20 DIAGNOSIS — M75101 Unspecified rotator cuff tear or rupture of right shoulder, not specified as traumatic: Secondary | ICD-10-CM | POA: Diagnosis not present

## 2022-09-20 MED ORDER — DIAZEPAM 10 MG PO TABS: ORAL_TABLET | ORAL | 0 refills | Status: AC

## 2022-09-21 ENCOUNTER — Encounter: Payer: Self-pay | Admitting: Orthopedic Surgery

## 2022-09-21 NOTE — Progress Notes (Signed)
Office Visit Note   Patient: Glen Rivera           Date of Birth: Feb 23, 1961           MRN: [id number] Visit Date: 09/20/2022 Requested by: London Pepper, MD [address] [place],  [place] [address] PCP: London Pepper, MD  Subjective: Chief Complaint  Patient presents with   Right Shoulder - Follow-up    HPI: Glen Rivera is a 62 y.o. male who presents to the office reporting right shoulder pain.  Reports continued pain and he is not too much better.  He describes less popping.  Hurts for him to scoot up his dog and pick the dog up.  He is able to use a [place].Marland Kitchen  He also reports pain with flyfishing.  He took 1 gout pill which helped.  Casting hurts him on the fishing front.  He has a history of 1 shot in each shoulder.  Bothering him some at night as well.             ROS: All systems reviewed are negative as they relate to the chief complaint within the history of present illness.  Patient denies fevers or chills.  Assessment & Plan: Visit Diagnoses:  1. Rotator cuff syndrome of right shoulder     Plan: Impression is persistent right shoulder pain with failure of conservative treatment.  Symptoms ongoing now for multiple months.  Differential diagnosis would be partial-thickness cuff tear versus labral pathology and biceps tendon pathology.  Plan is MRI arthrogram right shoulder to evaluate rotator cuff tear biceps tendon.  Valium prescribed for claustrophobia.  Follow-up after that study.  Follow-Up Instructions: No follow-ups on file.   Orders:  Orders Placed This Encounter  Procedures   MR SHOULDER RIGHT W CONTRAST   Arthrogram   Meds ordered this encounter  Medications   diazepam (VALIUM) 10 MG tablet    Sig: Take 1 30 minutes before MRI scan for anxiety.    Dispense:  2 tablet    Refill:  0      Procedures: No procedures performed   Clinical Data: No additional findings.  Objective: Vital Signs: There were no vitals taken for this  visit.  Physical Exam:  Constitutional: Patient appears well-developed HEENT:  Head: Normocephalic Eyes:EOM are normal Neck: Normal range of motion Cardiovascular: Normal rate Pulmonary/chest: Effort normal Neurologic: Patient is alert Skin: Skin is warm Psychiatric: Patient has normal mood and affect  Ortho Exam: Ortho exam demonstrates range of motion of 50/95/160.  Good rotator cuff strength demonstrate supraspinatus and subscap muscle testing.  No discrete AC joint tenderness on the right or left-hand side.  O'Brien's testing is equivocal on the right negative on the left.  Minimal coarse grinding and popping with internal and external rotation of the right shoulder at 90 degrees of abduction.  No other masses lymphadenopathy or skin changes noted in that shoulder girdle region.  Specialty Comments:  No specialty comments available.  Imaging: No results found.   PMFS History: Patient Active Problem List   Diagnosis Date Noted   Obesity (BMI 30.0-34.9) 05/26/2020   Tendinitis of right quadriceps tendon 10/08/2019   Rotator cuff syndrome of right shoulder 06/05/2018   Lateral epicondylitis of right elbow 06/05/2018   Chest pain 09/16/2011   Obstructive sleep apnea 09/04/2009   Past Medical History:  Diagnosis Date   Anxiety    Hyperlipidemia    Obesity    OSA (obstructive sleep apnea) 2003  History reviewed. No pertinent family history.  History reviewed. No pertinent surgical history. Social History   Occupational History   Not on file  Tobacco Use   Smoking status: Never   Smokeless tobacco: Never  Substance and Sexual Activity   Alcohol use: Yes    Comment: several beers a night   Drug use: No   Sexual activity: Yes

## 2022-10-11 ENCOUNTER — Ambulatory Visit: Payer: BC Managed Care – PPO | Admitting: Orthopedic Surgery

## 2022-10-12 ENCOUNTER — Ambulatory Visit
Admission: RE | Admit: 2022-10-12 | Discharge: 2022-10-12 | Disposition: A | Discharge status: Home / Self Care | Payer: BC Managed Care – PPO | Source: Ambulatory Visit | Attending: Orthopedic Surgery | Admitting: Orthopedic Surgery

## 2022-10-12 DIAGNOSIS — M75101 Unspecified rotator cuff tear or rupture of right shoulder, not specified as traumatic: Secondary | ICD-10-CM

## 2022-10-12 DIAGNOSIS — S46011A Strain of muscle(s) and tendon(s) of the rotator cuff of right shoulder, initial encounter: Secondary | ICD-10-CM | POA: Diagnosis not present

## 2022-10-12 MED ORDER — IOPAMIDOL (ISOVUE-M 200) INJECTION 41%
15.0000 mL | Freq: Once | INTRAMUSCULAR | Status: AC
  Administered 2022-10-12: 15 mL via INTRA_ARTICULAR

## 2022-10-20 ENCOUNTER — Ambulatory Visit (INDEPENDENT_AMBULATORY_CARE_PROVIDER_SITE_OTHER): Payer: BC Managed Care – PPO | Admitting: Surgical

## 2022-10-20 DIAGNOSIS — M75101 Unspecified rotator cuff tear or rupture of right shoulder, not specified as traumatic: Secondary | ICD-10-CM

## 2022-10-24 ENCOUNTER — Encounter: Payer: Self-pay | Admitting: Surgical

## 2022-10-24 NOTE — Progress Notes (Signed)
Office Visit Note   Patient: Glen Rivera           Date of Birth: 08-19-1960           MRN: [id number] Visit Date: 10/20/2022 Requested by: Farris Has, MD [address] [place],  [place] [address] PCP: Farris Has, MD  Subjective: Chief Complaint  Patient presents with   Right Shoulder - Follow-up    HPI: Glen Rivera is a 62 y.o. male who presents to the office for MRI review. Patient denies any changes in symptoms.  Continues to complain mainly of shoulder pain that radiates down to the bicep muscle.  He states that his pain is overall improved compared to when he saw Dr. August Saucer several months ago.  The popping is much more occasional now and not as bad as it was before.  He has had prior injections in the shoulder before by Dr. Denyse Amass.  He notes in particular he has difficulty with fishing (casting), lifting his small dog, laying on his right side at night.  Sounds he does not lay on his right side, he can sleep fairly well through the night.  MRI results revealed: MR SHOULDER RIGHT W CONTRAST  Result Date: 10/14/2022 CLINICAL DATA:  Right shoulder pain, limited range of motion EXAM: MRI OF THE RIGHT SHOULDER WITH CONTRAST TECHNIQUE: Multiplanar, multisequence MR imaging of the right shoulder was performed following the administration of intra-articular contrast. CONTRAST:  See Injection Documentation. COMPARISON:  None Available. FINDINGS: Rotator cuff: Moderate tendinosis of the supraspinatus tendon with a large insertional interstitial tear encompassing approximately 90% of the tendon with a small focal area of articular surface extension and a similar small bursal surface component anteriorly. Moderate tendinosis of the infraspinatus tendon. Teres minor tendon is intact. Subscapularis tendon is intact. Muscles: No muscle atrophy or edema. No intramuscular fluid collection or hematoma. Biceps Long Head: Moderate tendinosis of the intra-articular portion of the long  head of the biceps tendon. Acromioclavicular Joint: Moderate arthropathy of the acromioclavicular joint. Trace subacromial/subdeltoid bursal fluid. Glenohumeral Joint: Intraarticular contrast distending the joint capsule. Normal glenohumeral ligaments. Partial-thickness cartilage loss of the glenohumeral joint with mild subchondral reactive marrow changes in the inferior glenoid. Labrum: Anterior labral degeneration. Bones: No fracture or dislocation. No aggressive osseous lesion. Other: No fluid collection or hematoma. IMPRESSION: 1. Moderate tendinosis of the supraspinatus tendon with a large insertional interstitial tear encompassing approximately 90% of the tendon with a small focal area of articular surface extension and a similar small bursal surface component anteriorly. 2. Moderate tendinosis of the infraspinatus tendon. 3. Moderate tendinosis of the intra-articular portion of the long head of the biceps tendon. 4. Partial-thickness cartilage loss of the glenohumeral joint with mild subchondral reactive marrow changes in the inferior glenoid. Electronically Signed   By: Elige Ko M.D.   On: 10/14/2022 12:51                 ROS: All systems reviewed are negative as they relate to the chief complaint within the history of present illness.  Patient denies fevers or chills.  Assessment & Plan: Visit Diagnoses:  1. Rotator cuff syndrome of right shoulder     Plan: Glen Rivera is a 62 y.o. male who presents to the office for review of right shoulder MRI scan.  He has had intermittent episodes of severe right shoulder pain that has actually improved at this point.  He still continues to have subjective weakness in his daily  life as well as weakness that is notable on exam today with supra and infra strength testing.  Exam findings do make sense correlating with large supraspinatus tear as well as tendinosis of the long head of the bicep tendon.  He also has some partial-thickness cartilage loss of  the glenoid but this does not appear severe enough to warrant reverse shoulder replacement.  We discussed options available to patient after reviewing imaging.  Main options include ligament symptoms versus injection versus physical therapy versus surgical intervention in the form of right shoulder arthroscopy with release of the bicep tendon and subsequent mini open bicep tenodesis and rotator cuff tear repair.  We discussed the risks and benefits of this procedure including but not limited to the risk of nerve/blood vessel damage, infection, need for revision surgery in the future, persistent weakness, persistent shoulder pain, medical complication from surgery.  Also discussed the recovery timeframe after surgery.  He would like to proceed with surgery but he would like to have this done in June 2024.  Plan to post him for surgery and follow-up after procedure.  Follow-Up Instructions: No follow-ups on file.   Orders:  No orders of the defined types were placed in this encounter.  No orders of the defined types were placed in this encounter.     Procedures: No procedures performed   Clinical Data: No additional findings.  Objective: Vital Signs: There were no vitals taken for this visit.  Physical Exam:  Constitutional: Patient appears well-developed HEENT:  Head: Normocephalic Eyes:EOM are normal Neck: Normal range of motion Cardiovascular: Normal rate Pulmonary/chest: Effort normal Neurologic: Patient is alert Skin: Skin is warm Psychiatric: Patient has normal mood and affect  Ortho Exam: Ortho exam demonstrates right shoulder with 35 degrees X rotation, 85 degrees abduction, 140 degrees forward elevation both passively and actively.  He has intact EPL, FPL, grip strength testing, pronation/supination, bicep, tricep, deltoid.  Axillary nerves intact with deltoid firing.  He does have 5 -/5 supraspinatus weakness and 4/5 infraspinatus weakness of the right shoulder relative to  the left which has 5/5 motor strength.  5/5 subscapularis strength.  No Popeye deformity noted.  Tenderness over the bicipital groove moderately.  He has no tenderness over the [facility] joint.  Coarseness noted with passive motion of the shoulder.  Negative external rotation lag sign.  Negative Hornblower sign.  Positive O'Brien sign of the right shoulder.Marland Kitchen  Specialty Comments:  No specialty comments available.  Imaging: No results found.   PMFS History: Patient Active Problem List   Diagnosis Date Noted   Obesity (BMI 30.0-34.9) 05/26/2020   Tendinitis of right quadriceps tendon 10/08/2019   Rotator cuff syndrome of right shoulder 06/05/2018   Lateral epicondylitis of right elbow 06/05/2018   Chest pain 09/16/2011   Obstructive sleep apnea 09/04/2009   Past Medical History:  Diagnosis Date   Anxiety    Hyperlipidemia    Obesity    OSA (obstructive sleep apnea) 2003    No family history on file.  No past surgical history on file. Social History   Occupational History   Not on file  Tobacco Use   Smoking status: Never   Smokeless tobacco: Never  Substance and Sexual Activity   Alcohol use: Yes    Comment: several beers a night   Drug use: No   Sexual activity: Yes

## 2022-11-15 DIAGNOSIS — D3617 Benign neoplasm of peripheral nerves and autonomic nervous system of trunk, unspecified: Secondary | ICD-10-CM | POA: Diagnosis not present

## 2022-11-15 DIAGNOSIS — D225 Melanocytic nevi of trunk: Secondary | ICD-10-CM | POA: Diagnosis not present

## 2022-11-15 DIAGNOSIS — L814 Other melanin hyperpigmentation: Secondary | ICD-10-CM | POA: Diagnosis not present

## 2022-11-15 DIAGNOSIS — L821 Other seborrheic keratosis: Secondary | ICD-10-CM | POA: Diagnosis not present

## 2023-01-11 DIAGNOSIS — H5203 Hypermetropia, bilateral: Secondary | ICD-10-CM | POA: Diagnosis not present

## 2023-01-17 ENCOUNTER — Other Ambulatory Visit: Payer: Self-pay | Admitting: Surgical

## 2023-01-17 ENCOUNTER — Encounter: Payer: Self-pay | Admitting: Orthopedic Surgery

## 2023-01-17 DIAGNOSIS — M7521 Bicipital tendinitis, right shoulder: Secondary | ICD-10-CM | POA: Diagnosis not present

## 2023-01-17 DIAGNOSIS — S43431A Superior glenoid labrum lesion of right shoulder, initial encounter: Secondary | ICD-10-CM | POA: Diagnosis not present

## 2023-01-17 DIAGNOSIS — M24011 Loose body in right shoulder: Secondary | ICD-10-CM | POA: Diagnosis not present

## 2023-01-17 DIAGNOSIS — M75121 Complete rotator cuff tear or rupture of right shoulder, not specified as traumatic: Secondary | ICD-10-CM | POA: Diagnosis not present

## 2023-01-17 DIAGNOSIS — M7501 Adhesive capsulitis of right shoulder: Secondary | ICD-10-CM | POA: Diagnosis not present

## 2023-01-17 DIAGNOSIS — G8918 Other acute postprocedural pain: Secondary | ICD-10-CM | POA: Diagnosis not present

## 2023-01-17 DIAGNOSIS — M7551 Bursitis of right shoulder: Secondary | ICD-10-CM | POA: Diagnosis not present

## 2023-01-17 MED ORDER — METHOCARBAMOL 500 MG PO TABS: 500.0000 mg | ORAL_TABLET | Freq: Three times a day (TID) | ORAL | 1 refills | Status: AC | PRN

## 2023-01-17 MED ORDER — GABAPENTIN 300 MG PO CAPS: 300.0000 mg | ORAL_CAPSULE | Freq: Three times a day (TID) | ORAL | 0 refills | Status: AC

## 2023-01-17 MED ORDER — OXYCODONE HCL 5 MG PO TABS: 5.0000 mg | ORAL_TABLET | ORAL | 0 refills | Status: AC | PRN

## 2023-01-17 MED ORDER — CELECOXIB 100 MG PO CAPS: 100.0000 mg | ORAL_CAPSULE | Freq: Two times a day (BID) | ORAL | 0 refills | Status: AC

## 2023-01-19 ENCOUNTER — Telehealth: Payer: Self-pay

## 2023-01-19 NOTE — Telephone Encounter (Signed)
Patient's wife Corrie Dandy called stating that after patient took a shower today they noticed some bruising above his right elbow.  Stated that patient has some numbness in his hand and some swelling also.  Would like to know if they should be concerned?  Patient had right shoulder surgery on Monday, 01/17/2023.  Please advise.  Thank you

## 2023-01-19 NOTE — Telephone Encounter (Signed)
Called and advised this is normal. No concerns or further questions.  F/u monday

## 2023-01-24 ENCOUNTER — Ambulatory Visit (INDEPENDENT_AMBULATORY_CARE_PROVIDER_SITE_OTHER): Payer: BC Managed Care – PPO | Admitting: Surgical

## 2023-01-24 DIAGNOSIS — M75101 Unspecified rotator cuff tear or rupture of right shoulder, not specified as traumatic: Secondary | ICD-10-CM

## 2023-01-27 DIAGNOSIS — M7521 Bicipital tendinitis, right shoulder: Secondary | ICD-10-CM | POA: Diagnosis not present

## 2023-01-27 DIAGNOSIS — S43431A Superior glenoid labrum lesion of right shoulder, initial encounter: Secondary | ICD-10-CM | POA: Diagnosis not present

## 2023-01-27 DIAGNOSIS — M75121 Complete rotator cuff tear or rupture of right shoulder, not specified as traumatic: Secondary | ICD-10-CM | POA: Diagnosis not present

## 2023-01-28 ENCOUNTER — Encounter: Payer: Self-pay | Admitting: Surgical

## 2023-01-28 NOTE — Progress Notes (Signed)
   Post-Op Visit Note   Patient: Glen Rivera           Date of Birth: 1961/04/01           MRN: [id number] Visit Date: 01/24/2023 PCP: Farris Has, MD   Assessment & Plan:  Chief Complaint:  Chief Complaint  Patient presents with   Right Shoulder - Routine Post Op    R SHOULDER SCOPE (surgery date 01-17-23)   Visit Diagnoses: No diagnosis found.  Plan: Glen Rivera is a 62 y.o. male who presents s/p right shoulder rotator cuff repair and biceps tenodesis on 01/17/2023.  Patient is doing well and pain is overall controlled.  Up to 76 degrees on CPM machine.  Denies any chest pain, SOB, fevers, chills.  Has not had to take any pain medication since Thursday.  Has taken occasional Advil.  On exam, patient has range of motion 35 degrees X rotation, 80 degrees abduction, 120 degrees forward elevation passively.  Intact EPL, FPL, finger abduction, finger adduction, pronation/supination, bicep, tricep, deltoid of operative extremity.  Axillary nerve intact with deltoid firing.  Incisions are healing well without evidence of infection or dehiscence.  Sutures removed and replaced with Steri-Strips today.  2+ radial pulse of the operative extremity  Plan is continue with CPM machine to work on range of motion.  Doing a great job of getting some early range of motion at this point.  Absolutely no lifting with the operative extremity.  No active range of motion of the operative extremity at the shoulder either.  He is okay to come out of the sling when he is sedentary or throughout the day to allow his elbow to extend to limit elbow stiffness.  Follow-up with the office in 2 weeks for clinical recheck with Dr. August Saucer and initiation of PT at that time..   Follow-Up Instructions: No follow-ups on file.   Orders:  No orders of the defined types were placed in this encounter.  No orders of the defined types were placed in this encounter.   Imaging: No results found.  PMFS History: Patient Active  Problem List   Diagnosis Date Noted   Obesity (BMI 30.0-34.9) 05/26/2020   Tendinitis of right quadriceps tendon 10/08/2019   Rotator cuff syndrome of right shoulder 06/05/2018   Lateral epicondylitis of right elbow 06/05/2018   Chest pain 09/16/2011   Obstructive sleep apnea 09/04/2009   Past Medical History:  Diagnosis Date   Anxiety    Hyperlipidemia    Obesity    OSA (obstructive sleep apnea) 2003    No family history on file.  No past surgical history on file. Social History   Occupational History   Not on file  Tobacco Use   Smoking status: Never   Smokeless tobacco: Never  Substance and Sexual Activity   Alcohol use: Yes    Comment: several beers a night   Drug use: No   Sexual activity: Yes

## 2023-02-07 ENCOUNTER — Other Ambulatory Visit: Payer: Self-pay

## 2023-02-07 ENCOUNTER — Ambulatory Visit: Payer: BC Managed Care – PPO | Admitting: Orthopedic Surgery

## 2023-02-07 DIAGNOSIS — M65342 Trigger finger, left ring finger: Secondary | ICD-10-CM | POA: Diagnosis not present

## 2023-02-08 ENCOUNTER — Encounter: Payer: Self-pay | Admitting: Orthopedic Surgery

## 2023-02-08 NOTE — Progress Notes (Unsigned)
A  Procedure Note  Patient: Glen Rivera.             Date of Birth: 1960/12/13           MRN: [id number]             Visit Date: 02/07/2023  Procedures: Visit Diagnoses:  1. Trigger finger, left ring finger     Hand/UE Inj: L ring A1 for trigger finger on 02/07/2023 10:29 PM Indications: therapeutic Details: 25 G needle, ultrasound-guided volar approach Medications: 0.33 mL bupivacaine 0.25 %; 13.33 mg methylPREDNISolone acetate 40 MG/ML; 3 mL lidocaine 1 % Outcome: tolerated well, no immediate complications Procedure, treatment alternatives, risks and benefits explained, specific risks discussed. Consent was given by the patient. Immediately prior to procedure a time out was called to verify the correct patient, procedure, equipment, support staff and site/side marked as required. Patient was prepped and draped in the usual sterile fashion.         Post-Op Visit Note   Patient: Glen Rivera.           Date of Birth: [birth year]           MRN: [id number] Visit Date: 02/07/2023 PCP: Farris Has, MD   Assessment & Plan:  Chief Complaint:  Chief Complaint  Patient presents with   Right Shoulder - Routine Post Op    right shoulder rotator cuff repair and biceps tenodesis on 01/17/2023.     Visit Diagnoses:  1. Trigger finger, left ring finger     Plan: Glen Rivera is a patient who underwent right shoulder arthroscopy and rotator cuff tear repair on 01/17/2023.  Has been in a sling.  Reports some occasional biceps pain.  Also describes left trigger finger which has been going on for about 3 months.  Sleep is getting some better.  Sling is causing him some pain in his neck and wrist.  He is in the CPM machine at 107.  On examination he has no crepitus with internal/external rotation of that arm.  Strength is as expected following rotator cuff surgery.  Biceps morphology intact.  Does have tenderness at the base of the A1 pulley on that left fourth finger with triggering visible with  gripping.  Plan at this time is to discontinue the sling but I do not want him doing any lifting with that right arm.  Continue with CPM machine and we will start him in physical therapy when he comes back in 3 weeks.  I think Glen Rivera is going to be very active with this right shoulder in the future so I want a make sure that this rotator cuff is healed before proceeding with activity.  Follow-Up Instructions: No follow-ups on file.   Orders:  Orders Placed This Encounter  Procedures   US Guided Needle Placement - No Linked Charges   No orders of the defined types were placed in this encounter.   Imaging: No results found.  PMFS History: Patient Active Problem List   Diagnosis Date Noted   Obesity (BMI 30.0-34.9) 05/26/2020   Tendinitis of right quadriceps tendon 10/08/2019   Rotator cuff syndrome of right shoulder 06/05/2018   Lateral epicondylitis of right elbow 06/05/2018   Chest pain 09/16/2011   Obstructive sleep apnea 09/04/2009   Past Medical History:  Diagnosis Date   Anxiety    Hyperlipidemia    Obesity    OSA (obstructive sleep apnea) 2003    History reviewed. No pertinent family history.  History reviewed. No pertinent surgical  history. Social History   Occupational History   Not on file  Tobacco Use   Smoking status: Never   Smokeless tobacco: Never  Substance and Sexual Activity   Alcohol use: Yes    Comment: several beers a night   Drug use: No   Sexual activity: Yes

## 2023-02-09 MED ORDER — LIDOCAINE HCL 1 % IJ SOLN
3.0000 mL | INTRAMUSCULAR | Status: AC | PRN
Start: 2023-02-07 — End: 2023-02-07
  Administered 2023-02-07: 3 mL

## 2023-02-09 MED ORDER — BUPIVACAINE HCL 0.25 % IJ SOLN
0.3300 mL | INTRAMUSCULAR | Status: AC | PRN
Start: 2023-02-07 — End: 2023-02-07
  Administered 2023-02-07: .33 mL

## 2023-02-09 MED ORDER — METHYLPREDNISOLONE ACETATE 40 MG/ML IJ SUSP
13.3300 mg | INTRAMUSCULAR | Status: AC | PRN
Start: 2023-02-07 — End: 2023-02-07
  Administered 2023-02-07: 13.33 mg

## 2023-02-25 ENCOUNTER — Encounter: Payer: Self-pay | Admitting: Orthopedic Surgery

## 2023-02-28 DIAGNOSIS — E785 Hyperlipidemia, unspecified: Secondary | ICD-10-CM | POA: Diagnosis not present

## 2023-02-28 DIAGNOSIS — E538 Deficiency of other specified B group vitamins: Secondary | ICD-10-CM | POA: Diagnosis not present

## 2023-02-28 DIAGNOSIS — R5383 Other fatigue: Secondary | ICD-10-CM | POA: Diagnosis not present

## 2023-02-28 DIAGNOSIS — R110 Nausea: Secondary | ICD-10-CM | POA: Diagnosis not present

## 2023-02-28 DIAGNOSIS — Z125 Encounter for screening for malignant neoplasm of prostate: Secondary | ICD-10-CM | POA: Diagnosis not present

## 2023-02-28 DIAGNOSIS — Z0000 Encounter for general adult medical examination without abnormal findings: Secondary | ICD-10-CM | POA: Diagnosis not present

## 2023-03-02 ENCOUNTER — Ambulatory Visit (INDEPENDENT_AMBULATORY_CARE_PROVIDER_SITE_OTHER): Payer: BC Managed Care – PPO | Admitting: Surgical

## 2023-03-02 DIAGNOSIS — M75101 Unspecified rotator cuff tear or rupture of right shoulder, not specified as traumatic: Secondary | ICD-10-CM

## 2023-03-04 ENCOUNTER — Encounter: Payer: Self-pay | Admitting: Orthopedic Surgery

## 2023-03-04 NOTE — Progress Notes (Signed)
   Post-Op Visit Note   Patient: Glen Rivera.           Date of Birth: 10/08/1960           MRN: [id number] Visit Date: 03/02/2023 PCP: Farris Has, MD   Assessment & Plan:  Chief Complaint:  Chief Complaint  Patient presents with   Right Shoulder - Routine Post Op    R SHOULDER SCOPE (surgery date 01-17-23)   Visit Diagnoses:  1. Rotator cuff syndrome of right shoulder     Plan: Patient is a 62 year old male who presents s/p right shoulder rotator cuff tear repair and bicep tenodesis on 01/17/2023.  He also had left ring finger trigger finger injection on 02/07/2023 by Dr. August Saucer which significantly helped his symptoms.  Regarding the shoulder, pain is steadily improving.  He discontinued the CPM machine last Friday.  He does note occasional spasm and tightness in the bicep muscle belly but aside from this, pain is pretty well-controlled.  No fevers or chills.  On exam, patient has 30 degrees X rotation, 90 degrees abduction, 135 degrees forward elevation passively and actively.  Good rotator cuff strength of supraspinatus, infraspinatus rated 5 -/5.  Subscap strength rated 5/5.  Axillary nerve intact with deltoid firing.  Incisions are healing well without evidence of infection or dehiscence.  Excellent supination and bicep flexion strength without reproduction of pain.  No Popeye deformity noted.  Plan to start physical therapy upstairs to focus on full active range of motion and rotator cuff strengthening exercises.  He is planning on going on a fishing trip with a friend this coming weekend and I discouraged him against any intensive use of this operative shoulder.  Would recommend he either does not do any fishing or just fish with his left arm.  Follow-up in 6 weeks for clinical recheck.  Follow-Up Instructions: No follow-ups on file.   Orders:  Orders Placed This Encounter  Procedures   Ambulatory referral to Physical Therapy   No orders of the defined types were placed in  this encounter.   Imaging: No results found.  PMFS History: Patient Active Problem List   Diagnosis Date Noted   Obesity (BMI 30.0-34.9) 05/26/2020   Tendinitis of right quadriceps tendon 10/08/2019   Rotator cuff syndrome of right shoulder 06/05/2018   Lateral epicondylitis of right elbow 06/05/2018   Chest pain 09/16/2011   Obstructive sleep apnea 09/04/2009   Past Medical History:  Diagnosis Date   Anxiety    Hyperlipidemia    Obesity    OSA (obstructive sleep apnea) 2003    No family history on file.  No past surgical history on file. Social History   Occupational History   Not on file  Tobacco Use   Smoking status: Never   Smokeless tobacco: Never  Substance and Sexual Activity   Alcohol use: Yes    Comment: several beers a night   Drug use: No   Sexual activity: Yes

## 2023-03-14 ENCOUNTER — Encounter: Payer: Self-pay | Admitting: Physical Therapy

## 2023-03-14 ENCOUNTER — Ambulatory Visit: Payer: BC Managed Care – PPO | Admitting: Physical Therapy

## 2023-03-14 ENCOUNTER — Other Ambulatory Visit: Payer: Self-pay

## 2023-03-14 DIAGNOSIS — M25511 Pain in right shoulder: Secondary | ICD-10-CM | POA: Diagnosis not present

## 2023-03-14 DIAGNOSIS — M6281 Muscle weakness (generalized): Secondary | ICD-10-CM | POA: Diagnosis not present

## 2023-03-14 DIAGNOSIS — M25611 Stiffness of right shoulder, not elsewhere classified: Secondary | ICD-10-CM

## 2023-03-14 DIAGNOSIS — R6889 Other general symptoms and signs: Secondary | ICD-10-CM

## 2023-03-14 DIAGNOSIS — R600 Localized edema: Secondary | ICD-10-CM

## 2023-03-14 NOTE — Therapy (Signed)
OUTPATIENT PHYSICAL THERAPY UPPER EXTREMITY EVALUATION   Patient Name: Glen Rivera. MRN: [id number] DOB:October 12, 1960, 62 y.o., male Today's Date: 03/14/2023  END OF SESSION:  PT End of Session - 03/14/23 1045     Visit Number 1    Number of Visits 10    Date for PT Re-Evaluation 05/06/23    Authorization Type BCBS COMM PPO    Authorization Time Period 40% coinsurance, 30 PT visits    Authorization - Visit Number 1    Authorization - Number of Visits 30    PT Start Time 0802    PT Stop Time 0845    PT Time Calculation (min) 43 min    Activity Tolerance Patient tolerated treatment well;Patient limited by pain    Behavior During Therapy WFL for tasks assessed/performed             Past Medical History:  Diagnosis Date   Anxiety    Hyperlipidemia    Obesity    OSA (obstructive sleep apnea) 2003   History reviewed. No pertinent surgical history. Patient Active Problem List   Diagnosis Date Noted   Obesity (BMI 30.0-34.9) 05/26/2020   Tendinitis of right quadriceps tendon 10/08/2019   Rotator cuff syndrome of right shoulder 06/05/2018   Lateral epicondylitis of right elbow 06/05/2018   Chest pain 09/16/2011   Obstructive sleep apnea 09/04/2009    PCP: Farris Has, MD  REFERRING PROVIDER: Flint Melter Magnant, PA-C  REFERRING DIAG: M75.101 (ICD-10-CM) - Rotator cuff syndrome of right shoulder   THERAPY DIAG:  Stiffness of right shoulder, not elsewhere classified  Acute pain of right shoulder  Muscle weakness (generalized)  Localized edema  Impaired function of upper extremity  Rationale for Evaluation and Treatment: Rehabilitation  ONSET DATE: 01/17/2023 RTC tear repair and biceps tenodesis  SUBJECTIVE:                                                                                                                                                                                      SUBJECTIVE STATEMENT: He had a rotator cuff tear over a year.  Patient  underwent a right shoulder rotator cuff tear repair and biceps tenodesis on 01/17/2023. He also had a left ring trigger finger injection on 02/07/2023 with patient reporting 90% improved.  UE dominance: right hand  PERTINENT HISTORY: Obesity, tendinitis or right quadriceps, lateral epicondylitis right elbow  PAIN:  NPRS scale: 1-2/10 shoulder, 3/10 posterior neck, in last week lowest 0-1/10 & highest 8/10 Pain location: right shoulder more lateral & biceps, neck bw scapula & spine Pain description: biceps cramping with use, dull ache heavy feeling Aggravating factors: sitting mainly, biceps when using it Relieving  factors: rest it   PRECAUTIONS: Shoulder  WEIGHT BEARING RESTRICTIONS: No  FALLS:  Has patient fallen in last 6 months? No  LIVING ENVIRONMENT: Lives with: lives with their spouse and 3 dogs Lives in: House 2 story but Estate agent & bedroom main level Stairs: Yes: Internal: 16-18 steps; on right going up and External: 1 steps; none  OCCUPATION: Production designer, theatre/television/film for [employer],    PLOF: Independent  PATIENT GOALS:  fish, work in yard, use dominant arm without issues.   Next MD visit: 04/13/2023  OBJECTIVE:   PATIENT SURVEYS:  FOTO intake: 56    predicted:  70  COGNITION: Overall cognitive status: WFL     SENSATION: WFL except mild numbness at incision  POSTURE: Head forward, rounded shoulder  UPPER EXTREMITY ROM:   ROM Right eval Left eval  Shoulder flexion Seated A: 94* Supine P: 103* WFL  Shoulder extension    Shoulder abduction Seated scapation A: 61* Supine P: 73*   Shoulder adduction    Shoulder internal rotation Supine 45* abducted: P: 51*   Shoulder external rotation Supine 45* abducted: P: 41*    Elbow flexion    Elbow extension    Wrist flexion    Wrist extension    Wrist ulnar deviation    Wrist radial deviation    Wrist pronation    Wrist supination    (Blank rows = not tested)  UPPER EXTREMITY MMT:  MMT Right eval  Left eval  Shoulder flexion 3-/5   Shoulder extension    Shoulder abduction 3-/5   Shoulder adduction    Shoulder internal rotation    Shoulder external rotation 3-/5   Middle trapezius    Lower trapezius    Elbow flexion    Elbow extension    Wrist flexion    Wrist extension    Wrist ulnar deviation    Wrist radial deviation    Wrist pronation    Wrist supination    Grip strength (lbs)    (Blank rows = not tested)  PALPATION:  Eval on 03/14/2023: Tenderness right shoulder along incision, biceps tendon & muscle belly, Upper & middle trapezius, Levator Scapulae. Muscle atrophy noted in biceps, triceps, deltoid and scapular muscles.                                                                                                                                                                                                   TODAY'S TREATMENT:  DATE: 03/14/2023: Therex:    HEP instruction/performance c cues for techniques, handout provided.  Trial set performed of each for comprehension and symptom assessment.  See below for exercise list   PATIENT EDUCATION: Education details: HEP, POC Person educated: Patient Education method: Explanation, Demonstration, Verbal cues, and Handouts Education comprehension: verbalized understanding, returned demonstration, and verbal cues required  HOME EXERCISE PROGRAM: Access Code: [email or web address] URL: [email or web address] Date: 03/14/2023 Prepared by: Vladimir Faster  Exercises - Supine Chin Tuck  - 1 x daily - 5 x weekly - 1 sets - 10 reps - 5 seconds hold - Supine Scapular Retraction  - 2-3 x daily - 7 x weekly - 2-3 sets - 5-10 reps - 5 seconds hold - Supine Shoulder Flexion Extension AAROM with Dowel  - 2-3 x daily - 7 x weekly - 2-3 sets - 5-10 reps - 5 seconds hold - Supine Shoulder Abduction AAROM with Dowel  - 2-3 x daily -  7 x weekly - 2-3 sets - 10 reps - 5 seconds hold - Supine Shoulder External Rotation in 45 Degrees Abduction AAROM with Dowel  - 2-3 x daily - 7 x weekly - 2-3 sets - 10 reps - 5 seconds hold - Supine Shoulder Press with Dowel  - 2-3 x daily - 7 x weekly - 2-3 sets - 10 reps - 5 seconds hold - Supine Bicep Curl  - 2-3 x daily - 7 x weekly - 2-3 sets - 10 reps - 5 seconds hold  ASSESSMENT:  CLINICAL IMPRESSION: Patient is a 62 y.o. who comes to clinic with complaints of dominant right shoulder pain with mobility, strength and movement coordination deficits that impair their ability to perform usual daily and recreational functional activities without increase difficulty/symptoms at this time.  Patient to benefit from skilled PT services to address impairments and limitations to improve to previous level of function without restriction secondary to condition.   OBJECTIVE IMPAIRMENTS: decreased activity tolerance, decreased endurance, decreased knowledge of condition, decreased ROM, decreased strength, increased edema, increased muscle spasms, impaired flexibility, impaired UE functional use, improper body mechanics, postural dysfunction, and pain.   ACTIVITY LIMITATIONS: carrying, lifting, bathing, reach over head, and fishing & yard work  PARTICIPATION LIMITATIONS: meal prep, occupation, yard work, and fishing  PERSONAL FACTORS: Time since onset of injury/illness/exacerbation and 1-2 comorbidities: see PMH  are also affecting patient's functional outcome.   REHAB POTENTIAL: Good  CLINICAL DECISION MAKING: Stable/uncomplicated  EVALUATION COMPLEXITY: Low   GOALS: Goals reviewed with patient? Yes  SHORT TERM GOALS: (target date for Short term goals are 4 weeks 04/08/2023)  1.Patient will demonstrate independent use of home exercise program to maintain progress from in clinic treatments. Goal status: New  LONG TERM GOALS: (target dates for all long term goals are 8 weeks  05/06/2023 )    1. Patient will demonstrate/report pain at worst less than or equal to 2/10 to facilitate minimal limitation in daily activity secondary to pain symptoms. Goal status: New   2. Patient will demonstrate independent use of home exercise program to facilitate ability to maintain/progress functional gains from skilled physical therapy services. Goal status: New   3. Patient will demonstrate FOTO outcome > or = 70 % to indicate reduced disability due to condition. Goal status: New   4.  Patient will demonstrate right UE MMT >/= 4/5 throughout to facilitate lifting, reaching, carrying at [facility] in daily activity.   Goal status: New   5.  Patient will demonstrate right GH joint AROM flex >  100*, abd >90* and ER >60* without symptoms to facilitate usual overhead reaching, self care, dressing at PLOF.    Goal status: New   6.  patient reports able to use dominant right UE with fishing and yard work.   Goal status: New    PLAN:  PT FREQUENCY: 1-2x/week  PT DURATION: 8 weeks  PLANNED INTERVENTIONS: Therapeutic exercises, Therapeutic activity, Neuro Muscular re-education, Balance training, Gait training, Patient/Family education, Joint mobilization, Stair training, DME instructions, Dry Needling, Electrical stimulation, Traction, Cryotherapy, vasopneumatic device Moist heat, Taping, Ultrasound, Ionotophoresis 4mg /ml Dexamethasone, and aquatic therapy ,Manual therapy.  All included unless contraindicated  PLAN FOR NEXT SESSION: Review HEP and update,  manual therapy for range,      Vladimir Faster, PT, DPT 03/14/2023, 10:59 AM

## 2023-03-17 ENCOUNTER — Encounter: Payer: Self-pay | Admitting: Physical Therapy

## 2023-03-17 ENCOUNTER — Ambulatory Visit: Payer: BC Managed Care – PPO | Admitting: Physical Therapy

## 2023-03-17 DIAGNOSIS — R6889 Other general symptoms and signs: Secondary | ICD-10-CM

## 2023-03-17 DIAGNOSIS — M6281 Muscle weakness (generalized): Secondary | ICD-10-CM | POA: Diagnosis not present

## 2023-03-17 DIAGNOSIS — M25611 Stiffness of right shoulder, not elsewhere classified: Secondary | ICD-10-CM | POA: Diagnosis not present

## 2023-03-17 DIAGNOSIS — R600 Localized edema: Secondary | ICD-10-CM

## 2023-03-17 DIAGNOSIS — M25511 Pain in right shoulder: Secondary | ICD-10-CM

## 2023-03-17 NOTE — Therapy (Signed)
OUTPATIENT PHYSICAL THERAPY UPPER EXTREMITY EVALUATION   Patient Name: Glen Rivera. MRN: [id number] DOB:08-10-1960, 62 y.o., male Today's Date: 03/17/2023  END OF SESSION:  PT End of Session - 03/17/23 0806     Visit Number 2    Number of Visits 10    Date for PT Re-Evaluation 05/06/23    Authorization Type BCBS COMM PPO    Authorization Time Period 40% coinsurance, 30 PT visits    Authorization - Visit Number 2    Authorization - Number of Visits 30    PT Start Time 0802    PT Stop Time 0844    PT Time Calculation (min) 42 min    Activity Tolerance Patient tolerated treatment well;Patient limited by pain    Behavior During Therapy WFL for tasks assessed/performed              Past Medical History:  Diagnosis Date   Anxiety    Hyperlipidemia    Obesity    OSA (obstructive sleep apnea) 2003   History reviewed. No pertinent surgical history. Patient Active Problem List   Diagnosis Date Noted   Obesity (BMI 30.0-34.9) 05/26/2020   Tendinitis of right quadriceps tendon 10/08/2019   Rotator cuff syndrome of right shoulder 06/05/2018   Lateral epicondylitis of right elbow 06/05/2018   Chest pain 09/16/2011   Obstructive sleep apnea 09/04/2009    PCP: Farris Has, MD  REFERRING PROVIDER: Flint Melter Magnant, PA-C  REFERRING DIAG: M75.101 (ICD-10-CM) - Rotator cuff syndrome of right shoulder   THERAPY DIAG:  Stiffness of right shoulder, not elsewhere classified  Acute pain of right shoulder  Muscle weakness (generalized)  Localized edema  Impaired function of upper extremity  Rationale for Evaluation and Treatment: Rehabilitation  ONSET DATE: 01/17/2023 RTC tear repair and biceps tenodesis  SUBJECTIVE:                                                                                                                                                                                      SUBJECTIVE STATEMENT: He has been doing exercises and notices stiffness  the first 1-2 reps that goes away.  Eval:  He had a rotator cuff tear over a year.  Patient underwent a right shoulder rotator cuff tear repair and biceps tenodesis on 01/17/2023. He also had a left ring trigger finger injection on 02/07/2023 with patient reporting 90% improved.  UE dominance: right hand  PERTINENT HISTORY: Obesity, tendinitis or right quadriceps, lateral epicondylitis right elbow  PAIN:  NPRS scale: 0/10 shoulder, 5/10 posterior neck, in last week lowest 0/10 & highest 8/10 Pain location: right shoulder more lateral & biceps, neck bw scapula & spine  Pain description: biceps cramping with use, dull ache heavy feeling Aggravating factors: sitting mainly, biceps when using it Relieving factors: rest it   PRECAUTIONS: Shoulder  WEIGHT BEARING RESTRICTIONS: No  FALLS:  Has patient fallen in last 6 months? No  LIVING ENVIRONMENT: Lives with: lives with their spouse and 3 dogs Lives in: House 2 story but Estate agent & bedroom main level Stairs: Yes: Internal: 16-18 steps; on right going up and External: 1 steps; none  OCCUPATION: Production designer, theatre/television/film for [employer],    PLOF: Independent  PATIENT GOALS:  fish, work in yard, use dominant arm without issues.   Next MD visit: 04/13/2023  OBJECTIVE:   PATIENT SURVEYS:  FOTO intake: 56    predicted:  70  COGNITION: Overall cognitive status: WFL     SENSATION: WFL except mild numbness at incision  POSTURE: Head forward, rounded shoulder  UPPER EXTREMITY ROM:   ROM Right eval Left eval Right 03/17/23  Shoulder flexion Seated A: 94* Supine P: 103* WFL Supine A: 141* P: 142*  Shoulder extension     Shoulder abduction Seated scapation A: 61* Supine P: 73*  Supine A: 119* P: 120*  Shoulder adduction     Shoulder internal rotation Supine 45* abducted: P: 51*  Supine 45* abducted: A: 55* P: 57*  Shoulder external rotation Supine 45* abducted: P: 41*   Supine 45* abducted: A: 61* P: 62*  Elbow  flexion     Elbow extension     Wrist flexion     Wrist extension     Wrist ulnar deviation     Wrist radial deviation     Wrist pronation     Wrist supination     (Blank rows = not tested)  UPPER EXTREMITY MMT:  MMT Right eval  Shoulder flexion 3-/5  Shoulder extension   Shoulder abduction 3-/5  Shoulder adduction   Shoulder internal rotation   Shoulder external rotation 3-/5  Middle trapezius   Lower trapezius   Elbow flexion   Elbow extension   Wrist flexion   Wrist extension   Wrist ulnar deviation   Wrist radial deviation   Wrist pronation   Wrist supination   Grip strength (lbs)   (Blank rows = not tested)  PALPATION:  Eval on 03/14/2023: Tenderness right shoulder along incision, biceps tendon & muscle belly, Upper & middle trapezius, Levator Scapulae. Muscle atrophy noted in biceps, triceps, deltoid and scapular muscles.  TODAY'S TREATMENT:                                                                                                       DATE: 03/17/2023: Therapeutic Exercise: Seated closed chain chin tuck 5 sec hold make sure not to hold breath 15 reps Scapular retraction seated with posterior head holding pillow to Wieland 5 sec hold make sure not to hold breath 15 reps Pulley flexion & scapation 2 min with PT verbal & demo cues motion including following hand with eyes shoulder level and higher. Isometric right shoulder flexion, IR, ER, ext, abd & add with door 5 sec hold 10 reps ea.  PT added all above to HEP with demo, verbal, tactile & HO cues. Pt verbalized & return demo understanding.    03/14/2023: Therex:    HEP instruction/performance c cues for techniques, handout provided.  Trial set performed of each for comprehension and symptom assessment.  See below for  exercise list   PATIENT EDUCATION: Education details: HEP, POC Person educated: Patient Education method: Explanation, Demonstration, Verbal cues, and Handouts Education comprehension: verbalized understanding, returned demonstration, and verbal cues required  HOME EXERCISE PROGRAM: Access Code: [email or web address] URL: [email or web address] Date: 03/17/2023 Prepared by: Vladimir Faster  Exercises - Supine Chin Tuck  - 1 x daily - 5 x weekly - 1 sets - 10 reps - 5 seconds hold - Supine Scapular Retraction  - 2-3 x daily - 7 x weekly - 2-3 sets - 5-10 reps - 5 seconds hold - Supine Shoulder Flexion Extension AAROM with Dowel  - 2-3 x daily - 7 x weekly - 2-3 sets - 5-10 reps - 5 seconds hold - Supine Shoulder Abduction AAROM with Dowel  - 2-3 x daily - 7 x weekly - 2-3 sets - 10 reps - 5 seconds hold - Supine Shoulder External Rotation in 45 Degrees Abduction AAROM with Dowel  - 2-3 x daily - 7 x weekly - 2-3 sets - 10 reps - 5 seconds hold - Supine Shoulder Press with Dowel  - 2-3 x daily - 7 x weekly - 2-3 sets - 10 reps - 5 seconds hold - Supine Bicep Curl  - 2-3 x daily - 7 x weekly - 2-3 sets - 10 reps - 5 seconds hold - Seated Isometric Cervical Retraction with Chin Tucks with Pillow behind head  - 2-3 x daily - 7 x weekly - 2-3 sets - 10 reps - 5 seconds hold - Seated Scapular Retraction  - 2-3 x daily - 7 x weekly - 2-3 sets - 10 reps - 5 seconds hold - Seated Shoulder Flexion AAROM with Pulley Behind  - 2 x daily - 7 x weekly - 1 sets - 1 reps - 2 minutes hold - Seated Shoulder Scaption AAROM with Pulley at Side  - 2 x daily - 7 x weekly - 1 sets - 1 reps - 2 minutes hold - Standing Isometric Shoulder Flexion with Doorway - Arm Bent  - 1-2 x daily - 7 x weekly - 1-2 sets - 10 reps - 5 seconds  hold - Standing Isometric Shoulder Internal Rotation at Doorway  - 1-2 x daily - 7 x weekly - 1-2 sets - 10 reps - 5 seconds hold - Standing Isometric Shoulder External Rotation with  Doorway  - 1-2 x daily - 7 x weekly - 1-2 sets - 10 reps - 5 seconds hold - Standing Isometric Shoulder Abduction with Doorway - Arm Bent  - 1-2 x daily - 7 x weekly - 1-2 sets - 10 reps - 5 seconds hold - Standing Isometric Shoulder Extension with Doorway - Arm Bent  - 1-2 x daily - 7 x weekly - 1-2 sets - 10 reps - 5 seconds hold - Isometric Shoulder Adduction  - 1-2 x daily - 7 x weekly - 1-2 sets - 10 reps - 5 seconds hold  ASSESSMENT:  CLINICAL IMPRESSION: Patient had improved AROM with passive only 1-2* beyond active range.  He is reporting less pain overall.  He appears to understand updated HEP with progression.   OBJECTIVE IMPAIRMENTS: decreased activity tolerance, decreased endurance, decreased knowledge of condition, decreased ROM, decreased strength, increased edema, increased muscle spasms, impaired flexibility, impaired UE functional use, improper body mechanics, postural dysfunction, and pain.   ACTIVITY LIMITATIONS: carrying, lifting, bathing, reach over head, and fishing & yard work  PARTICIPATION LIMITATIONS: meal prep, occupation, yard work, and fishing  PERSONAL FACTORS: Time since onset of injury/illness/exacerbation and 1-2 comorbidities: see PMH  are also affecting patient's functional outcome.   REHAB POTENTIAL: Good  CLINICAL DECISION MAKING: Stable/uncomplicated  EVALUATION COMPLEXITY: Low   GOALS: Goals reviewed with patient? Yes  SHORT TERM GOALS: (target date for Short term goals are 4 weeks 04/08/2023)  1.Patient will demonstrate independent use of home exercise program to maintain progress from in clinic treatments. Goal status: Ongoing 03/17/2023  LONG TERM GOALS: (target dates for all long term goals are 8 weeks  05/06/2023 )   1. Patient will demonstrate/report pain at worst less than or equal to 2/10 to facilitate minimal limitation in daily activity secondary to pain symptoms. Goal status: Ongoing 03/17/2023   2. Patient will demonstrate  independent use of home exercise program to facilitate ability to maintain/progress functional gains from skilled physical therapy services. Goal status: Ongoing 03/17/2023   3. Patient will demonstrate FOTO outcome > or = 70 % to indicate reduced disability due to condition. Goal status: Ongoing 03/17/2023   4.  Patient will demonstrate right UE MMT >/= 4/5 throughout to facilitate lifting, reaching, carrying at [facility] in daily activity.   Goal status: Ongoing 03/17/2023   5.  Patient will demonstrate right GH joint AROM flex >100*, abd >90* and ER >60* without symptoms to facilitate usual overhead reaching, self care, dressing at PLOF.    Goal status: Ongoing 03/17/2023   6.  patient reports able to use dominant right UE with fishing and yard work.   Goal status: Ongoing 03/17/2023    PLAN:  PT FREQUENCY: 1-2x/week  PT DURATION: 8 weeks  PLANNED INTERVENTIONS: Therapeutic exercises, Therapeutic activity, Neuro Muscular re-education, Balance training, Gait training, Patient/Family education, Joint mobilization, Stair training, DME instructions, Dry Needling, Electrical stimulation, Traction, Cryotherapy, vasopneumatic device Moist heat, Taping, Ultrasound, Ionotophoresis 4mg /ml Dexamethasone, and aquatic therapy ,Manual therapy.  All included unless contraindicated  PLAN FOR NEXT SESSION: check on updated HEP, add levator scapulae stretch and reactive shoulder with red theraband.      Vladimir Faster, PT, DPT 03/17/2023, 8:51 AM

## 2023-03-22 ENCOUNTER — Ambulatory Visit: Payer: BC Managed Care – PPO | Admitting: Physical Therapy

## 2023-03-22 ENCOUNTER — Encounter: Payer: Self-pay | Admitting: Physical Therapy

## 2023-03-22 DIAGNOSIS — M6281 Muscle weakness (generalized): Secondary | ICD-10-CM

## 2023-03-22 DIAGNOSIS — M25511 Pain in right shoulder: Secondary | ICD-10-CM | POA: Diagnosis not present

## 2023-03-22 DIAGNOSIS — R6889 Other general symptoms and signs: Secondary | ICD-10-CM

## 2023-03-22 DIAGNOSIS — M25611 Stiffness of right shoulder, not elsewhere classified: Secondary | ICD-10-CM | POA: Diagnosis not present

## 2023-03-22 DIAGNOSIS — R600 Localized edema: Secondary | ICD-10-CM | POA: Diagnosis not present

## 2023-03-22 NOTE — Therapy (Signed)
OUTPATIENT PHYSICAL THERAPY UPPER EXTREMITY TREATMENT   Patient Name: Glen Rivera. MRN: [id number] DOB:09/21/1960, 62 y.o., male Today's Date: 03/22/2023  END OF SESSION:  PT End of Session - 03/22/23 1347     Visit Number 3    Number of Visits 10    Date for PT Re-Evaluation 05/06/23    Authorization Type [employer] COMM PPO    Authorization Time Period 40% coinsurance, 30 PT visits    Authorization - Visit Number 3    Authorization - Number of Visits 30    PT Start Time 1345    PT Stop Time 1427    PT Time Calculation (min) 42 min    Activity Tolerance Patient tolerated treatment well;Patient limited by pain    Behavior During Therapy WFL for tasks assessed/performed               Past Medical History:  Diagnosis Date   Anxiety    Hyperlipidemia    Obesity    OSA (obstructive sleep apnea) 2003   History reviewed. No pertinent surgical history. Patient Active Problem List   Diagnosis Date Noted   Obesity (BMI 30.0-34.9) 05/26/2020   Tendinitis of right quadriceps tendon 10/08/2019   Rotator cuff syndrome of right shoulder 06/05/2018   Lateral epicondylitis of right elbow 06/05/2018   Chest pain 09/16/2011   Obstructive sleep apnea 09/04/2009    PCP: Farris Has, MD  REFERRING PROVIDER: Flint Melter Magnant, PA-C  REFERRING DIAG: M75.101 (ICD-10-CM) - Rotator cuff syndrome of right shoulder   THERAPY DIAG:  Stiffness of right shoulder, not elsewhere classified  Acute pain of right shoulder  Muscle weakness (generalized)  Localized edema  Impaired function of upper extremity  Rationale for Evaluation and Treatment: Rehabilitation  ONSET DATE: 01/17/2023 RTC tear repair and biceps tenodesis  SUBJECTIVE:                                                                                                                                                                                      SUBJECTIVE STATEMENT: He got pulleys in today.  He did isometric  exercises at door and continue with dowel rod.    Eval:  He had a rotator cuff tear over a year.  Patient underwent a right shoulder rotator cuff tear repair and biceps tenodesis on 01/17/2023. He also had a left ring trigger finger injection on 02/07/2023 with patient reporting 90% improved.  UE dominance: right hand  PERTINENT HISTORY: Obesity, tendinitis or right quadriceps, lateral epicondylitis right elbow  PAIN:  NPRS scale:  0/10 shoulder, 3/10 posterior neck, in last week lowest 0/10 & highest 3/10 Pain location: right shoulder more lateral &  biceps, neck bw scapula & spine Pain description: biceps cramping with use, dull ache heavy feeling Aggravating factors: sitting mainly, biceps when using it Relieving factors: rest it   PRECAUTIONS: Shoulder  WEIGHT BEARING RESTRICTIONS: No  FALLS:  Has patient fallen in last 6 months? No  LIVING ENVIRONMENT: Lives with: lives with their spouse and 3 dogs Lives in: House 2 story but Estate agent & bedroom main level Stairs: Yes: Internal: 16-18 steps; on right going up and External: 1 steps; none  OCCUPATION: Production designer, theatre/television/film for [employer],    PLOF: Independent  PATIENT GOALS:  fish, work in yard, use dominant arm without issues.   Next MD visit: 04/13/2023  OBJECTIVE:   PATIENT SURVEYS:  FOTO intake: 56    predicted:  70  COGNITION: Overall cognitive status: WFL     SENSATION: WFL except mild numbness at incision  POSTURE: Head forward, rounded shoulder  UPPER EXTREMITY ROM:   ROM Right eval Left eval Right 03/17/23  Shoulder flexion Seated A: 94* Supine P: 103* WFL Supine A: 141* P: 142*  Shoulder extension     Shoulder abduction Seated scapation A: 61* Supine P: 73*  Supine A: 119* P: 120*  Shoulder adduction     Shoulder internal rotation Supine 45* abducted: P: 51*  Supine 45* abducted: A: 55* P: 57*  Shoulder external rotation Supine 45* abducted: P: 41*   Supine 45* abducted: A: 61* P:  62*  Elbow flexion     Elbow extension     Wrist flexion     Wrist extension     Wrist ulnar deviation     Wrist radial deviation     Wrist pronation     Wrist supination     (Blank rows = not tested)  UPPER EXTREMITY MMT:  MMT Right eval  Shoulder flexion 3-/5  Shoulder extension   Shoulder abduction 3-/5  Shoulder adduction   Shoulder internal rotation   Shoulder external rotation 3-/5  Middle trapezius   Lower trapezius   Elbow flexion   Elbow extension   Wrist flexion   Wrist extension   Wrist ulnar deviation   Wrist radial deviation   Wrist pronation   Wrist supination   Grip strength (lbs)   (Blank rows = not tested)  PALPATION:  Eval on 03/14/2023: Tenderness right shoulder along incision, biceps tendon & muscle belly, Upper & middle trapezius, Levator Scapulae. Muscle atrophy noted in biceps, triceps, deltoid and scapular muscles.  TODAY'S TREATMENT:                                                                                                       DATE: 03/22/2023: Therapeutic Exercise: Pulley flexion & scapation 3 min with PT verbal & demo cues motion including following hand with eyes shoulder level and higher. Levator Scapulae stretch seated 20 sec hold 3 reps Reactive shoulder stabilization red theraband for ER, IR & flexion 10 reps 2 sets ea Standing scapular retraction 10 reps 2 sets PT added all above to HEP with demo, verbal, tactile & HO cues. Pt verbalized & return demo understanding.   Therapeutic Activities: Casting motion with simulated fishing rod using mirror for monitoring shoulder motion / not elevating. 2 min ea forward cast which was more elbow motions and side cast motion.     03/17/2023: Therapeutic Exercise: Seated closed chain chin tuck 5  sec hold make sure not to hold breath 15 reps Scapular retraction seated with posterior head holding pillow to Mealing 5 sec hold make sure not to hold breath 15 reps Pulley flexion & scapation 2 min with PT verbal & demo cues motion including following hand with eyes shoulder level and higher. Isometric right shoulder flexion, IR, ER, ext, abd & add with door 5 sec hold 10 reps ea.  PT added all above to HEP with demo, verbal, tactile & HO cues. Pt verbalized & return demo understanding.    03/14/2023: Therex:    HEP instruction/performance c cues for techniques, handout provided.  Trial set performed of each for comprehension and symptom assessment.  See below for exercise list   PATIENT EDUCATION: Education details: HEP, POC Person educated: Patient Education method: Explanation, Demonstration, Verbal cues, and Handouts Education comprehension: verbalized understanding, returned demonstration, and verbal cues required  HOME EXERCISE PROGRAM: Access Code: [email or web address] URL: [email or web address] Date: 03/22/2023 Prepared by: Vladimir Faster  Exercises - Supine Chin Tuck  - 1 x daily - 5 x weekly - 1 sets - 10 reps - 5 seconds hold - Supine Scapular Retraction  - 2-3 x daily - 7 x weekly - 2-3 sets - 5-10 reps - 5 seconds hold - Supine Shoulder Flexion Extension AAROM with Dowel  - 2-3 x daily - 7 x weekly - 2-3 sets - 5-10 reps - 5 seconds hold - Supine Shoulder Abduction AAROM with Dowel  - 2-3 x daily - 7 x weekly - 2-3 sets - 10 reps - 5 seconds hold - Supine Shoulder External Rotation in 45 Degrees Abduction AAROM with Dowel  - 2-3 x daily - 7 x weekly - 2-3 sets - 10 reps - 5 seconds hold - Supine Shoulder Press with Dowel  - 2-3 x daily - 7 x weekly - 2-3 sets - 10 reps - 5 seconds hold - Supine Bicep Curl  - 2-3 x daily - 7 x weekly - 2-3 sets - 10 reps - 5 seconds hold - Seated Isometric Cervical Retraction with Chin Tucks with Pillow behind head  - 2-3 x daily - 7 x  weekly - 2-3 sets -  10 reps - 5 seconds hold - Seated Scapular Retraction  - 2-3 x daily - 7 x weekly - 2-3 sets - 10 reps - 5 seconds hold - Seated Shoulder Flexion AAROM with Pulley Behind  - 2 x daily - 7 x weekly - 1 sets - 1 reps - 2 minutes hold - Seated Shoulder Scaption AAROM with Pulley at Side  - 2 x daily - 7 x weekly - 1 sets - 1 reps - 2 minutes hold - Standing Isometric Shoulder Flexion with Doorway - Arm Bent  - 1-2 x daily - 7 x weekly - 1-2 sets - 10 reps - 5 seconds hold - Standing Isometric Shoulder Internal Rotation at Doorway  - 1-2 x daily - 7 x weekly - 1-2 sets - 10 reps - 5 seconds hold - Standing Isometric Shoulder External Rotation with Doorway  - 1-2 x daily - 7 x weekly - 1-2 sets - 10 reps - 5 seconds hold - Standing Isometric Shoulder Abduction with Doorway - Arm Bent  - 1-2 x daily - 7 x weekly - 1-2 sets - 10 reps - 5 seconds hold - Standing Isometric Shoulder Extension with Doorway - Arm Bent  - 1-2 x daily - 7 x weekly - 1-2 sets - 10 reps - 5 seconds hold - Isometric Shoulder Adduction  - 1-2 x daily - 7 x weekly - 1-2 sets - 10 reps - 5 seconds hold - Seated Levator Scapulae Stretch  - 1-3 x daily - 7 x weekly - 1 sets - 3 reps - 20 seconds hold - Shoulder External Rotation Reactive Isometrics (Mirrored)  - 1 x daily - 7 x weekly - 2 sets - 10 reps - 5 hold - Shoulder Internal Rotation Reactive Isometrics  - 1 x daily - 7 x weekly - 2 sets - 10 reps - 5 seconds hold - Standing Shoulder Flexion Reactive Isometric (Mirrored)  - 1 x daily - 7 x weekly - 2 sets - 10 reps - 5 seconds hold - Standing Row with Anchored Resistance  - 1 x daily - 7 x weekly - 2 sets - 10 reps - 5 seconds hold  ASSESSMENT:  CLINICAL IMPRESSION: Patient appears to understand theraband reactive and scapular retraction exercises.  He was able to simulate a fishing cast motion without shoulder pain or substitution.   OBJECTIVE IMPAIRMENTS: decreased activity tolerance, decreased endurance,  decreased knowledge of condition, decreased ROM, decreased strength, increased edema, increased muscle spasms, impaired flexibility, impaired UE functional use, improper body mechanics, postural dysfunction, and pain.   ACTIVITY LIMITATIONS: carrying, lifting, bathing, reach over head, and fishing & yard work  PARTICIPATION LIMITATIONS: meal prep, occupation, yard work, and fishing  PERSONAL FACTORS: Time since onset of injury/illness/exacerbation and 1-2 comorbidities: see PMH  are also affecting patient's functional outcome.   REHAB POTENTIAL: Good  CLINICAL DECISION MAKING: Stable/uncomplicated  EVALUATION COMPLEXITY: Low   GOALS: Goals reviewed with patient? Yes  SHORT TERM GOALS: (target date for Short term goals are 4 weeks 04/08/2023)  1.Patient will demonstrate independent use of home exercise program to maintain progress from in clinic treatments. Goal status: Ongoing 03/17/2023  LONG TERM GOALS: (target dates for all long term goals are 8 weeks  05/06/2023 )   1. Patient will demonstrate/report pain at worst less than or equal to 2/10 to facilitate minimal limitation in daily activity secondary to pain symptoms. Goal status: Ongoing 03/17/2023   2. Patient will demonstrate independent use of home exercise program  to facilitate ability to maintain/progress functional gains from skilled physical therapy services. Goal status: Ongoing 03/17/2023   3. Patient will demonstrate FOTO outcome > or = 70 % to indicate reduced disability due to condition. Goal status: Ongoing 03/17/2023   4.  Patient will demonstrate right UE MMT >/= 4/5 throughout to facilitate lifting, reaching, carrying at [facility] in daily activity.   Goal status: Ongoing 03/17/2023   5.  Patient will demonstrate right GH joint AROM flex >100*, abd >90* and ER >60* without symptoms to facilitate usual overhead reaching, self care, dressing at PLOF.    Goal status: Ongoing 03/17/2023   6.  patient reports able to use  dominant right UE with fishing and yard work.   Goal status: Ongoing 03/17/2023    PLAN:  PT FREQUENCY: 1-2x/week  PT DURATION: 8 weeks  PLANNED INTERVENTIONS: Therapeutic exercises, Therapeutic activity, Neuro Muscular re-education, Balance training, Gait training, Patient/Family education, Joint mobilization, Stair training, DME instructions, Dry Needling, Electrical stimulation, Traction, Cryotherapy, vasopneumatic device Moist heat, Taping, Ultrasound, Ionotophoresis 4mg /ml Dexamethasone, and aquatic therapy ,Manual therapy.  All included unless contraindicated  PLAN FOR NEXT SESSION: review HEP and may move dowel supine to seated or standing. Then reduce frequency to 1x/wk with PT updating HEP>     Vladimir Faster, PT, DPT 03/22/2023, 2:35 PM

## 2023-03-24 ENCOUNTER — Encounter: Payer: Self-pay | Admitting: Physical Therapy

## 2023-03-24 ENCOUNTER — Ambulatory Visit: Payer: BC Managed Care – PPO | Admitting: Physical Therapy

## 2023-03-24 DIAGNOSIS — R600 Localized edema: Secondary | ICD-10-CM | POA: Diagnosis not present

## 2023-03-24 DIAGNOSIS — M6281 Muscle weakness (generalized): Secondary | ICD-10-CM

## 2023-03-24 DIAGNOSIS — M25511 Pain in right shoulder: Secondary | ICD-10-CM | POA: Diagnosis not present

## 2023-03-24 DIAGNOSIS — M25611 Stiffness of right shoulder, not elsewhere classified: Secondary | ICD-10-CM | POA: Diagnosis not present

## 2023-03-24 DIAGNOSIS — R6889 Other general symptoms and signs: Secondary | ICD-10-CM

## 2023-03-24 NOTE — Therapy (Signed)
OUTPATIENT PHYSICAL THERAPY UPPER EXTREMITY TREATMENT   Patient Name: Glen Rivera. MRN: [id number] DOB:Oct 11, 1960, 62 y.o., male Today's Date: 03/24/2023  END OF SESSION:  PT End of Session - 03/24/23 0802     Visit Number 4    Number of Visits 10    Date for PT Re-Evaluation 05/06/23    Authorization Type [employer] COMM PPO    Authorization Time Period 40% coinsurance, 30 PT visits    Authorization - Visit Number 4    Authorization - Number of Visits 30    PT Start Time 0800    PT Stop Time 0848    PT Time Calculation (min) 48 min    Activity Tolerance Patient tolerated treatment well;Patient limited by pain    Behavior During Therapy WFL for tasks assessed/performed                Past Medical History:  Diagnosis Date   Anxiety    Hyperlipidemia    Obesity    OSA (obstructive sleep apnea) 2003   History reviewed. No pertinent surgical history. Patient Active Problem List   Diagnosis Date Noted   Obesity (BMI 30.0-34.9) 05/26/2020   Tendinitis of right quadriceps tendon 10/08/2019   Rotator cuff syndrome of right shoulder 06/05/2018   Lateral epicondylitis of right elbow 06/05/2018   Chest pain 09/16/2011   Obstructive sleep apnea 09/04/2009    PCP: Farris Has, MD  REFERRING PROVIDER: Flint Melter Magnant, PA-C  REFERRING DIAG: M75.101 (ICD-10-CM) - Rotator cuff syndrome of right shoulder   THERAPY DIAG:  Stiffness of right shoulder, not elsewhere classified  Acute pain of right shoulder  Muscle weakness (generalized)  Localized edema  Impaired function of upper extremity  Rationale for Evaluation and Treatment: Rehabilitation  ONSET DATE: 01/17/2023 RTC tear repair and biceps tenodesis  SUBJECTIVE:                                                                                                                                                                                      SUBJECTIVE STATEMENT: Reports biceps gets sore or cramps some during  the day.   Eval:  He had a rotator cuff tear over a year.  Patient underwent a right shoulder rotator cuff tear repair and biceps tenodesis on 01/17/2023. He also had a left ring trigger finger injection on 02/07/2023 with patient reporting 90% improved.  UE dominance: right hand  PERTINENT HISTORY: Obesity, tendinitis or right quadriceps, lateral epicondylitis right elbow  PAIN:  NPRS scale: 0/10 shoulder, 0/10 posterior neck, in last week lowest 0/10 & highest 3/10 Pain location: right shoulder more lateral & biceps, neck bw scapula & spine Pain description:  biceps cramping with use, dull ache heavy feeling Aggravating factors: sitting mainly, biceps when using it Relieving factors: rest it   PRECAUTIONS: Shoulder  WEIGHT BEARING RESTRICTIONS: No  FALLS:  Has patient fallen in last 6 months? No  LIVING ENVIRONMENT: Lives with: lives with their spouse and 3 dogs Lives in: House 2 story but Estate agent & bedroom main level Stairs: Yes: Internal: 16-18 steps; on right going up and External: 1 steps; none  OCCUPATION: Production designer, theatre/television/film for [employer],    PLOF: Independent  PATIENT GOALS:  fish, work in yard, use dominant arm without issues.   Next MD visit: 04/13/2023  OBJECTIVE:   PATIENT SURVEYS:  FOTO intake: 56    predicted:  70  COGNITION: Overall cognitive status: WFL     SENSATION: WFL except mild numbness at incision  POSTURE: Head forward, rounded shoulder  UPPER EXTREMITY ROM:   ROM Right eval Left eval Right 03/17/23 Right 03/24/23  Shoulder flexion Seated A: 94* Supine P: 103* WFL Supine A: 141* P: 142* Seated A: 119* P: 142*  Shoulder extension      Shoulder abduction Seated scapation A: 61* Supine P: 73*  Supine A: 119* P: 120* Seated A: 87*  Shoulder adduction      Shoulder internal rotation Supine 45* abducted: P: 51*  Supine 45* abducted: A: 55* P: 57*   Shoulder external rotation Supine 45* abducted: P: 41*   Supine 45*  abducted: A: 61* P: 62*   Elbow flexion      Elbow extension      Wrist flexion      Wrist extension      Wrist ulnar deviation      Wrist radial deviation      Wrist pronation      Wrist supination      (Blank rows = not tested)  UPPER EXTREMITY MMT:  MMT Right eval  Shoulder flexion 3-/5  Shoulder extension   Shoulder abduction 3-/5  Shoulder adduction   Shoulder internal rotation   Shoulder external rotation 3-/5  Middle trapezius   Lower trapezius   Elbow flexion   Elbow extension   Wrist flexion   Wrist extension   Wrist ulnar deviation   Wrist radial deviation   Wrist pronation   Wrist supination   Grip strength (lbs)   (Blank rows = not tested)  PALPATION:  Eval on 03/14/2023: Tenderness right shoulder along incision, biceps tendon & muscle belly, Upper & middle trapezius, Levator Scapulae. Muscle atrophy noted in biceps, triceps, deltoid and scapular muscles.  TODAY'S TREATMENT:                                                                                                       DATE: 03/24/2023: Therapeutic Exercise: PT updated dowel exercises with standing options for flexion, abduction & external rotation. Pt performed 10 reps of each correctly.  PT verbal cues that he can do them either supine or standing but does not need to do both necessarily.  PT reviewed supine vs seated chin tuck & scapular retraction. Pt performed seated with cues for open chain using fingers on chin to facilitate proper motion. Pulley flexion & scapation 3 min with PT verbal & demo cues motion including following hand with eyes shoulder level and higher. Levator Scapulae stretch seated 20 sec hold 3 reps Reactive shoulder stabilization red theraband for ER, IR & flexion 10 reps 2 sets  ea Standing elbow extension with palm facing forward, inward & backward for 10 sec hold 1 rep 2 sets of ea.  Working on minimizing elbow flexion with standing & gait.  Worked on gait with mirror focusing on not holding right elbow in flexed position compared to LUE. PT updated HEP with verbal cues on options of positions but not needed to do both. PT added standing version of dowel exercises, posture exercises as option.  PT verbal & tactile corrections on exercises.     03/22/2023: Therapeutic Exercise: Pulley flexion & scapation 3 min with PT verbal & demo cues motion including following hand with eyes shoulder level and higher. Levator Scapulae stretch seated 20 sec hold 3 reps Reactive shoulder stabilization red theraband for ER, IR & flexion 10 reps 2 sets ea Standing scapular retraction 10 reps 2 sets PT added all above to HEP with demo, verbal, tactile & HO cues. Pt verbalized & return demo understanding.   Therapeutic Activities: Casting motion with simulated fishing rod using mirror for monitoring shoulder motion / not elevating. 2 min ea forward cast which was more elbow motions and side cast motion.     03/17/2023: Therapeutic Exercise: Seated closed chain chin tuck 5 sec hold make sure not to hold breath 15 reps Scapular retraction seated with posterior head holding pillow to Cathers 5 sec hold make sure not to hold breath 15 reps Pulley flexion & scapation 2 min with PT verbal & demo cues motion including following hand with eyes shoulder level and higher. Isometric right shoulder flexion, IR, ER, ext, abd & add with door 5 sec hold 10 reps ea.  PT added all above to HEP with demo, verbal, tactile & HO cues. Pt verbalized & return demo understanding.    03/14/2023: Therex:    HEP instruction/performance c cues for techniques, handout provided.  Trial set performed of each for comprehension and symptom assessment.  See below for exercise list   PATIENT EDUCATION: Education  details: HEP, POC Person educated: Patient Education method: Explanation, Demonstration, Verbal cues, and Handouts Education comprehension: verbalized understanding, returned demonstration, and verbal cues required  HOME EXERCISE PROGRAM: Access Code: [email or web address] URL: [email or web address] Date: 03/24/2023 Prepared by: Vladimir Faster  Exercises - Supine Chin Tuck  - 1 x daily - 5 x weekly - 1 sets - 10 reps - 5 seconds hold - Seated Isometric Cervical Retraction with Chin Tucks with Pillow behind head  - 2-3 x daily - 7 x weekly - 2-3 sets - 10 reps - 5 seconds hold - Seated Scapular Retraction  - 2-3 x daily - 7 x weekly - 2-3 sets - 10 reps - 5 seconds hold - Supine Scapular Retraction  - 2-3 x daily - 7 x weekly - 2-3 sets - 5-10 reps - 5 seconds hold - Supine Shoulder Flexion Extension AAROM with Dowel  - 2-3 x daily - 7 x weekly - 2-3 sets - 5-10 reps - 5 seconds hold - Supine Shoulder Abduction AAROM with Dowel  - 2-3 x daily - 7 x weekly - 2-3 sets - 10 reps - 5 seconds hold - Supine Shoulder External Rotation in 45 Degrees Abduction AAROM with Dowel  - 2-3 x daily - 7 x weekly - 2-3 sets - 10 reps - 5 seconds hold - Supine Shoulder Press with Dowel  - 2-3 x daily - 7 x weekly - 2-3 sets - 10 reps - 5 seconds hold - Standing Shoulder Flexion AAROM with Dowel  - 1-3 x daily - 7 x weekly - 1 sets - 10-15 reps - 5 seconds hold - Standing Shoulder Abduction AAROM with Dowel  - 1-3 x daily - 7 x weekly - 1 sets - 10-15 reps - 5 seconds hold - Standing Shoulder External Rotation AAROM with Dowel  - 1-3 x daily - 7 x weekly - 1 sets - 10-15 reps - 5 seconds hold - Seated Shoulder Flexion AAROM with Pulley Behind  - 2 x daily - 7 x weekly - 1 sets - 1 reps - 2 minutes hold - Seated Shoulder Scaption AAROM with Pulley at Side  - 2 x daily - 7 x weekly - 1 sets - 1 reps - 2 minutes hold - Standing Isometric Shoulder Flexion with Doorway - Arm Bent  - 1-2 x daily - 7 x weekly - 1-2  sets - 10 reps - 5 seconds hold - Standing Isometric Shoulder Internal Rotation at Doorway  - 1-2 x daily - 7 x weekly - 1-2 sets - 10 reps - 5 seconds hold - Standing Isometric Shoulder External Rotation with Doorway  - 1-2 x daily - 7 x weekly - 1-2 sets - 10 reps - 5 seconds hold - Standing Isometric Shoulder Abduction with Doorway - Arm Bent  - 1-2 x daily - 7 x weekly - 1-2 sets - 10 reps - 5 seconds hold - Standing Isometric Shoulder Extension with Doorway - Arm Bent  - 1-2 x daily - 7 x weekly - 1-2 sets - 10 reps - 5 seconds hold - Isometric Shoulder Adduction  - 1-2 x daily - 7 x weekly - 1-2 sets - 10 reps - 5 seconds hold - Seated Levator Scapulae Stretch  - 1-3 x daily - 7 x weekly - 1 sets - 3 reps - 20 seconds hold - Shoulder External Rotation Reactive Isometrics (Mirrored)  - 1 x daily - 7 x weekly - 2 sets - 10 reps - 5 hold - Shoulder Internal Rotation Reactive Isometrics  - 1 x daily - 7 x weekly - 2 sets - 10 reps - 5 seconds hold - Standing Shoulder Flexion Reactive Isometric (Mirrored)  - 1 x daily - 7 x weekly -  2 sets - 10 reps - 5 seconds hold - Standing Row with Anchored Resistance  - 1 x daily - 7 x weekly - 2 sets - 10 reps - 5 seconds hold - Standing Triceps Extension Single Arm   - 1 x daily - 7 x weekly - 1 sets - 1-2 reps - 10-15 seconds hold  ASSESSMENT:  CLINICAL IMPRESSION: Patient appears to have an understanding of HEP with options of positions. He improved not holding right elbow in flexed position with standing and gait.   OBJECTIVE IMPAIRMENTS: decreased activity tolerance, decreased endurance, decreased knowledge of condition, decreased ROM, decreased strength, increased edema, increased muscle spasms, impaired flexibility, impaired UE functional use, improper body mechanics, postural dysfunction, and pain.   ACTIVITY LIMITATIONS: carrying, lifting, bathing, reach over head, and fishing & yard work  PARTICIPATION LIMITATIONS: meal prep, occupation, yard  work, and fishing  PERSONAL FACTORS: Time since onset of injury/illness/exacerbation and 1-2 comorbidities: see PMH  are also affecting patient's functional outcome.   REHAB POTENTIAL: Good  CLINICAL DECISION MAKING: Stable/uncomplicated  EVALUATION COMPLEXITY: Low   GOALS: Goals reviewed with patient? Yes  SHORT TERM GOALS: (target date for Short term goals are 4 weeks 04/08/2023)  1.Patient will demonstrate independent use of home exercise program to maintain progress from in clinic treatments. Goal status: Ongoing 03/17/2023  LONG TERM GOALS: (target dates for all long term goals are 8 weeks  05/06/2023 )   1. Patient will demonstrate/report pain at worst less than or equal to 2/10 to facilitate minimal limitation in daily activity secondary to pain symptoms. Goal status: Ongoing 03/17/2023   2. Patient will demonstrate independent use of home exercise program to facilitate ability to maintain/progress functional gains from skilled physical therapy services. Goal status: Ongoing 03/17/2023   3. Patient will demonstrate FOTO outcome > or = 70 % to indicate reduced disability due to condition. Goal status: Ongoing 03/17/2023   4.  Patient will demonstrate right UE MMT >/= 4/5 throughout to facilitate lifting, reaching, carrying at [facility] in daily activity.   Goal status: Ongoing 03/17/2023   5.  Patient will demonstrate right GH joint AROM flex >100*, abd >90* and ER >60* without symptoms to facilitate usual overhead reaching, self care, dressing at PLOF.    Goal status: Ongoing 03/17/2023   6.  patient reports able to use dominant right UE with fishing and yard work.   Goal status: Ongoing 03/17/2023    PLAN:  PT FREQUENCY: 1-2x/week  PT DURATION: 8 weeks  PLANNED INTERVENTIONS: Therapeutic exercises, Therapeutic activity, Neuro Muscular re-education, Balance training, Gait training, Patient/Family education, Joint mobilization, Stair training, DME instructions, Dry Needling,  Electrical stimulation, Traction, Cryotherapy, vasopneumatic device Moist heat, Taping, Ultrasound, Ionotophoresis 4mg /ml Dexamethasone, and aquatic therapy ,Manual therapy.  All included unless contraindicated  PLAN FOR NEXT SESSION: Reduce frequency to 1x/wk with PT updating HEP. Progress HEP as indicated.      Vladimir Faster, PT, DPT 03/24/2023, 10:04 AM

## 2023-03-28 ENCOUNTER — Ambulatory Visit: Payer: BC Managed Care – PPO | Admitting: Physical Therapy

## 2023-03-28 ENCOUNTER — Encounter: Payer: Self-pay | Admitting: Physical Therapy

## 2023-03-28 DIAGNOSIS — M25611 Stiffness of right shoulder, not elsewhere classified: Secondary | ICD-10-CM | POA: Diagnosis not present

## 2023-03-28 DIAGNOSIS — R600 Localized edema: Secondary | ICD-10-CM

## 2023-03-28 DIAGNOSIS — M25511 Pain in right shoulder: Secondary | ICD-10-CM | POA: Diagnosis not present

## 2023-03-28 DIAGNOSIS — M6281 Muscle weakness (generalized): Secondary | ICD-10-CM

## 2023-03-28 DIAGNOSIS — R6889 Other general symptoms and signs: Secondary | ICD-10-CM

## 2023-03-28 NOTE — Therapy (Signed)
OUTPATIENT PHYSICAL THERAPY UPPER EXTREMITY TREATMENT   Patient Name: Glen Rivera. MRN: [id number] DOB:[birth year], 62 y.o., male Today's Date: 03/28/2023  END OF SESSION:  PT End of Session - 03/28/23 0758     Visit Number 5    Number of Visits 10    Date for PT Re-Evaluation 05/06/23    Authorization Type [employer] COMM PPO    Authorization Time Period 40% coinsurance, 30 PT visits    Authorization - Visit Number 5    Authorization - Number of Visits 30    PT Start Time 0758    PT Stop Time 0836    PT Time Calculation (min) 38 min    Activity Tolerance Patient tolerated treatment well;Patient limited by pain    Behavior During Therapy WFL for tasks assessed/performed                 Past Medical History:  Diagnosis Date   Anxiety    Hyperlipidemia    Obesity    OSA (obstructive sleep apnea) 2003   History reviewed. No pertinent surgical history. Patient Active Problem List   Diagnosis Date Noted   Obesity (BMI 30.0-34.9) 05/26/2020   Tendinitis of right quadriceps tendon 10/08/2019   Rotator cuff syndrome of right shoulder 06/05/2018   Lateral epicondylitis of right elbow 06/05/2018   Chest pain 09/16/2011   Obstructive sleep apnea 09/04/2009    PCP: Farris Has, MD  REFERRING PROVIDER: Flint Melter Magnant, PA-C  REFERRING DIAG: M75.101 (ICD-10-CM) - Rotator cuff syndrome of right shoulder   THERAPY DIAG:  Stiffness of right shoulder, not elsewhere classified  Acute pain of right shoulder  Muscle weakness (generalized)  Localized edema  Impaired function of upper extremity  Rationale for Evaluation and Treatment: Rehabilitation  ONSET DATE: 01/17/2023 RTC tear repair and biceps tenodesis  SUBJECTIVE:                                                                                                                                                                                      SUBJECTIVE STATEMENT: He has been doing his exercises especially the  biceps stretches. His arm has been feeling better.    Eval:  He had a rotator cuff tear over a year.  Patient underwent a right shoulder rotator cuff tear repair and biceps tenodesis on 01/17/2023. He also had a left ring trigger finger injection on 02/07/2023 with patient reporting 90% improved.  UE dominance: right hand  PERTINENT HISTORY: Obesity, tendinitis or right quadriceps, lateral epicondylitis right elbow  PAIN:  NPRS scale:  0/10 shoulder, 1/10 posterior neck, in last week lowest 0/10 & highest 3/10 Pain location: right shoulder more lateral &  biceps, neck bw scapula & spine Pain description: biceps cramping with use, dull ache heavy feeling Aggravating factors: sitting mainly, biceps when using it Relieving factors: rest it   PRECAUTIONS: Shoulder  WEIGHT BEARING RESTRICTIONS: No  FALLS:  Has patient fallen in last 6 months? No  LIVING ENVIRONMENT: Lives with: lives with their spouse and 3 dogs Lives in: House 2 story but Estate agent & bedroom main level Stairs: Yes: Internal: 16-18 steps; on right going up and External: 1 steps; none  OCCUPATION: Production designer, theatre/television/film for [employer],    PLOF: Independent  PATIENT GOALS:  fish, work in yard, use dominant arm without issues.   Next MD visit: 04/13/2023  OBJECTIVE:   PATIENT SURVEYS:  FOTO intake: 56    predicted:  70  COGNITION: Overall cognitive status: WFL     SENSATION: WFL except mild numbness at incision  POSTURE: Head forward, rounded shoulder  UPPER EXTREMITY ROM:   ROM Right eval Left eval Right 03/17/23 Right 03/24/23  Shoulder flexion Seated A: 94* Supine P: 103* WFL Supine A: 141* P: 142* Seated A: 119* P: 142*  Shoulder extension      Shoulder abduction Seated scapation A: 61* Supine P: 73*  Supine A: 119* P: 120* Seated A: 87*  Shoulder adduction      Shoulder internal rotation Supine 45* abducted: P: 51*  Supine 45* abducted: A: 55* P: 57*   Shoulder external rotation  Supine 45* abducted: P: 41*   Supine 45* abducted: A: 61* P: 62*   Elbow flexion      Elbow extension      Wrist flexion      Wrist extension      Wrist ulnar deviation      Wrist radial deviation      Wrist pronation      Wrist supination      (Blank rows = not tested)  UPPER EXTREMITY MMT:  MMT Right eval  Shoulder flexion 3-/5  Shoulder extension   Shoulder abduction 3-/5  Shoulder adduction   Shoulder internal rotation   Shoulder external rotation 3-/5  Middle trapezius   Lower trapezius   Elbow flexion   Elbow extension   Wrist flexion   Wrist extension   Wrist ulnar deviation   Wrist radial deviation   Wrist pronation   Wrist supination   Grip strength (lbs)   (Blank rows = not tested)  PALPATION:  Eval on 03/14/2023: Tenderness right shoulder along incision, biceps tendon & muscle belly, Upper & middle trapezius, Levator Scapulae. Muscle atrophy noted in biceps, triceps, deltoid and scapular muscles.  TODAY'S TREATMENT:                                                                                                       DATE: 03/28/2023: Therapeutic Exercise: Pulley flexion & abduction 3 min with PT verbal & demo cues motion for abduction. PT progressed from scapation to abduction today.  Push-up off counter 10 reps Prone scapular retraction 3 ways (hands over head shoulder flexion, elbows bent 90* horizontal abduction and arms at sides shoulder extension) with dowel rod for AAROM. 10 reps ea. Prone AROM external rotation & internal rotation 10 reps ea. Standing yellow Theraband external rotation with scapular retraction. Pt continues to have RUE scapular winging. PT added above exercises to HEP with HO, demo & verbal cues. Pt verbalized & return demo understanding.    Manual Therapy PROM Right shoulder flexion, abduction & external rotation.  Gentle inferior glides.    03/24/2023: Therapeutic Exercise: PT updated dowel exercises with standing options for flexion, abduction & external rotation. Pt performed 10 reps of each correctly.  PT verbal cues that he can do them either supine or standing but does not need to do both necessarily.  PT reviewed supine vs seated chin tuck & scapular retraction. Pt performed seated with cues for open chain using fingers on chin to facilitate proper motion. Pulley flexion & scapation 3 min with PT verbal & demo cues motion including following hand with eyes shoulder level and higher. Levator Scapulae stretch seated 20 sec hold 3 reps Reactive shoulder stabilization red theraband for ER, IR & flexion 10 reps 2 sets ea Standing elbow extension with palm facing forward, inward & backward for 10 sec hold 1 rep 2 sets of ea.  Working on minimizing elbow flexion with standing & gait.  Worked on gait with mirror focusing on not holding right elbow in flexed position compared to LUE. PT updated HEP with verbal cues on options of positions but not needed to do both. PT added standing version of dowel exercises, posture exercises as option.  PT verbal & tactile corrections on exercises.     03/22/2023: Therapeutic Exercise: Pulley flexion & scapation 3 min with PT verbal & demo cues motion including following hand with eyes shoulder level and higher. Levator Scapulae stretch seated 20 sec hold 3 reps Reactive shoulder stabilization red theraband for ER, IR & flexion 10 reps 2 sets ea Standing scapular retraction 10 reps 2 sets PT added all above to HEP with demo, verbal, tactile & HO cues. Pt verbalized & return demo understanding.   Therapeutic Activities: Casting motion with simulated fishing rod using mirror for monitoring shoulder motion / not elevating. 2 min ea forward cast which was more elbow motions and side cast motion.       PATIENT EDUCATION: Education details: HEP, POC Person educated: Patient Education method: Programmer, multimedia, Demonstration, Verbal cues, and Handouts Education comprehension: verbalized understanding, returned demonstration, and verbal cues required  HOME EXERCISE PROGRAM: Access Code: [email or web address] URL: [email or web address] Date: 03/28/2023 Prepared by: Vladimir Faster  Exercises - Supine Chin Tuck  - 1 x daily -  5 x weekly - 1 sets - 10 reps - 5 seconds hold - Seated Isometric Cervical Retraction with Chin Tucks with Pillow behind head  - 2-3 x daily - 7 x weekly - 2-3 sets - 10 reps - 5 seconds hold - Seated Scapular Retraction  - 2-3 x daily - 7 x weekly - 2-3 sets - 10 reps - 5 seconds hold - Supine Scapular Retraction  - 2-3 x daily - 7 x weekly - 2-3 sets - 5-10 reps - 5 seconds hold - Supine Shoulder Flexion Extension AAROM with Dowel  - 2-3 x daily - 7 x weekly - 2-3 sets - 5-10 reps - 5 seconds hold - Supine Shoulder Abduction AAROM with Dowel  - 2-3 x daily - 7 x weekly - 2-3 sets - 10 reps - 5 seconds hold - Supine Shoulder External Rotation in 45 Degrees Abduction AAROM with Dowel  - 2-3 x daily - 7 x weekly - 2-3 sets - 10 reps - 5 seconds hold - Supine Shoulder Press with Dowel  - 2-3 x daily - 7 x weekly - 2-3 sets - 10 reps - 5 seconds hold - Standing Shoulder Flexion AAROM with Dowel  - 1-3 x daily - 7 x weekly - 1 sets - 10-15 reps - 5 seconds hold - Standing Shoulder Abduction AAROM with Dowel  - 1-3 x daily - 7 x weekly - 1 sets - 10-15 reps - 5 seconds hold - Standing Shoulder External Rotation AAROM with Dowel  - 1-3 x daily - 7 x weekly - 1 sets - 10-15 reps - 5 seconds hold - Seated Shoulder Flexion AAROM with Pulley Behind  - 2 x daily - 7 x weekly - 1 sets - 1 reps - 2 minutes hold - Seated Shoulder Scaption AAROM with Pulley at Side  - 2 x daily - 7 x weekly - 1 sets - 1 reps - 2 minutes hold - Standing Isometric Shoulder Flexion with Doorway - Arm Bent   - 1-2 x daily - 7 x weekly - 1-2 sets - 10 reps - 5 seconds hold - Standing Isometric Shoulder Internal Rotation at Doorway  - 1-2 x daily - 7 x weekly - 1-2 sets - 10 reps - 5 seconds hold - Standing Isometric Shoulder External Rotation with Doorway  - 1-2 x daily - 7 x weekly - 1-2 sets - 10 reps - 5 seconds hold - Standing Isometric Shoulder Abduction with Doorway - Arm Bent  - 1-2 x daily - 7 x weekly - 1-2 sets - 10 reps - 5 seconds hold - Standing Isometric Shoulder Extension with Doorway - Arm Bent  - 1-2 x daily - 7 x weekly - 1-2 sets - 10 reps - 5 seconds hold - Isometric Shoulder Adduction  - 1-2 x daily - 7 x weekly - 1-2 sets - 10 reps - 5 seconds hold - Seated Levator Scapulae Stretch  - 1-3 x daily - 7 x weekly - 1 sets - 3 reps - 20 seconds hold - Shoulder External Rotation Reactive Isometrics (Mirrored)  - 1 x daily - 7 x weekly - 2 sets - 10 reps - 5 hold - Shoulder Internal Rotation Reactive Isometrics  - 1 x daily - 7 x weekly - 2 sets - 10 reps - 5 seconds hold - Standing Shoulder Flexion Reactive Isometric (Mirrored)  - 1 x daily - 7 x weekly - 2 sets - 10 reps - 5 seconds hold - Standing  Row with Anchored Resistance  - 1 x daily - 7 x weekly - 2 sets - 10 reps - 5 seconds hold - Standing Triceps Extension Single Arm   - 1 x daily - 7 x weekly - 1 sets - 1-2 reps - 10-15 seconds hold - Push-Up on Counter  - 1 x daily - 7 x weekly - 1-2 sets - 10 reps - 5 seconds hold - Prone Scapular Retraction in Flexion  - 1 x daily - 7 x weekly - 1-2 sets - 10 reps - 5 seconds hold - Prone Scapular Retraction in Abduction  - 1 x daily - 7 x weekly - 1-2 sets - 10 reps - 5 seconds hold - Prone Shoulder Extension with Dowel  - 1 x daily - 7 x weekly - 1-2 sets - 10 reps - 5 seconds hold - Prone Shoulder External & Internal Rotation  - 1 x daily - 7 x weekly - 1-2 sets - 10 reps - 5 seconds hold - Shoulder External Rotation and Scapular Retraction with Resistance  - 1 x daily - 7 x weekly -  1-2 sets - 10 reps - 5 seconds hold  ASSESSMENT:  CLINICAL IMPRESSION: PT added exercises to address scapula winging which he appears to understand.   OBJECTIVE IMPAIRMENTS: decreased activity tolerance, decreased endurance, decreased knowledge of condition, decreased ROM, decreased strength, increased edema, increased muscle spasms, impaired flexibility, impaired UE functional use, improper body mechanics, postural dysfunction, and pain.   ACTIVITY LIMITATIONS: carrying, lifting, bathing, reach over head, and fishing & yard work  PARTICIPATION LIMITATIONS: meal prep, occupation, yard work, and fishing  PERSONAL FACTORS: Time since onset of injury/illness/exacerbation and 1-2 comorbidities: see PMH  are also affecting patient's functional outcome.   REHAB POTENTIAL: Good  CLINICAL DECISION MAKING: Stable/uncomplicated  EVALUATION COMPLEXITY: Low   GOALS: Goals reviewed with patient? Yes  SHORT TERM GOALS: (target date for Short term goals are 4 weeks 04/08/2023)  1.Patient will demonstrate independent use of home exercise program to maintain progress from in clinic treatments. Goal status: Ongoing 03/28/2023  LONG TERM GOALS: (target dates for all long term goals are 8 weeks  05/06/2023 )   1. Patient will demonstrate/report pain at worst less than or equal to 2/10 to facilitate minimal limitation in daily activity secondary to pain symptoms. Goal status: Ongoing 03/28/2023   2. Patient will demonstrate independent use of home exercise program to facilitate ability to maintain/progress functional gains from skilled physical therapy services. Goal status: Ongoing 03/28/2023   3. Patient will demonstrate FOTO outcome > or = 70 % to indicate reduced disability due to condition. Goal status: Ongoing 03/28/2023   4.  Patient will demonstrate right UE MMT >/= 4/5 throughout to facilitate lifting, reaching, carrying at [facility] in daily activity.   Goal status: Ongoing 03/28/2023   5.  Patient  will demonstrate right GH joint AROM flex >100*, abd >90* and ER >60* without symptoms to facilitate usual overhead reaching, self care, dressing at PLOF.    Goal status: Ongoing 03/28/2023   6.  patient reports able to use dominant right UE with fishing and yard work.   Goal status: Ongoing 03/28/2023    PLAN:  PT FREQUENCY: 1-2x/week  PT DURATION: 8 weeks  PLANNED INTERVENTIONS: Therapeutic exercises, Therapeutic activity, Neuro Muscular re-education, Balance training, Gait training, Patient/Family education, Joint mobilization, Stair training, DME instructions, Dry Needling, Electrical stimulation, Traction, Cryotherapy, vasopneumatic device Moist heat, Taping, Ultrasound, Ionotophoresis 4mg /ml Dexamethasone, and aquatic therapy ,Manual therapy.  All included unless contraindicated  PLAN FOR NEXT SESSION:  Check STG, do interim FOTO,   Reduce frequency to 1x/wk with PT updating HEP. Progress HEP as indicated.      Vladimir Faster, PT, DPT 03/28/2023, 8:37 AM

## 2023-04-04 ENCOUNTER — Encounter: Payer: Self-pay | Admitting: Physical Therapy

## 2023-04-04 ENCOUNTER — Ambulatory Visit: Payer: BC Managed Care – PPO | Admitting: Physical Therapy

## 2023-04-04 DIAGNOSIS — M25511 Pain in right shoulder: Secondary | ICD-10-CM

## 2023-04-04 DIAGNOSIS — M6281 Muscle weakness (generalized): Secondary | ICD-10-CM

## 2023-04-04 DIAGNOSIS — R600 Localized edema: Secondary | ICD-10-CM | POA: Diagnosis not present

## 2023-04-04 DIAGNOSIS — M25611 Stiffness of right shoulder, not elsewhere classified: Secondary | ICD-10-CM

## 2023-04-04 NOTE — Therapy (Signed)
OUTPATIENT PHYSICAL THERAPY UPPER EXTREMITY TREATMENT   Patient Name: Glen Rivera. MRN: [id number] DOB:February 16, 1961, 62 y.o., male Today's Date: 04/04/2023  END OF SESSION:  PT End of Session - 04/04/23 0805     Visit Number 6    Number of Visits 10    Date for PT Re-Evaluation 05/06/23    Authorization Type [employer] COMM PPO    Authorization - Visit Number 6    Authorization - Number of Visits 30    PT Start Time 0800    PT Stop Time 0839    PT Time Calculation (min) 39 min    Activity Tolerance Patient tolerated treatment well;Patient limited by pain    Behavior During Therapy WFL for tasks assessed/performed                  Past Medical History:  Diagnosis Date   Anxiety    Hyperlipidemia    Obesity    OSA (obstructive sleep apnea) 2003   History reviewed. No pertinent surgical history. Patient Active Problem List   Diagnosis Date Noted   Obesity (BMI 30.0-34.9) 05/26/2020   Tendinitis of right quadriceps tendon 10/08/2019   Rotator cuff syndrome of right shoulder 06/05/2018   Lateral epicondylitis of right elbow 06/05/2018   Chest pain 09/16/2011   Obstructive sleep apnea 09/04/2009    PCP: Farris Has, MD  REFERRING PROVIDER: Flint Melter Magnant, PA-C  REFERRING DIAG: M75.101 (ICD-10-CM) - Rotator cuff syndrome of right shoulder   THERAPY DIAG:  Stiffness of right shoulder, not elsewhere classified  Muscle weakness (generalized)  Acute pain of right shoulder  Localized edema  Rationale for Evaluation and Treatment: Rehabilitation  ONSET DATE: 01/17/2023 RTC tear repair and biceps tenodesis  SUBJECTIVE:                                                                                                                                                                                      04/04/23: pt stating he was able to fish for about 8 hours on Saturday.    Eval:  He had a rotator cuff tear over a year.  Patient underwent a right shoulder rotator  cuff tear repair and biceps tenodesis on 01/17/2023. He also had a left ring trigger finger injection on 02/07/2023 with patient reporting 90% improved.  UE dominance: right hand  PERTINENT HISTORY: Obesity, tendinitis or right quadriceps, lateral epicondylitis right elbow  PAIN:  NPRS scale:  0/10 shoulder at present, bicep and neck pain 4/10 on worse days Pain location: right shoulder more lateral & biceps, neck bw scapula & spine Pain description: biceps cramping with use, dull ache heavy feeling Aggravating factors: sitting mainly,  biceps when using it Relieving factors: rest it   PRECAUTIONS: Shoulder  WEIGHT BEARING RESTRICTIONS: No  FALLS:  Has patient fallen in last 6 months? No  LIVING ENVIRONMENT: Lives with: lives with their spouse and 3 dogs Lives in: House 2 story but Estate agent & bedroom main level Stairs: Yes: Internal: 16-18 steps; on right going up and External: 1 steps; none  OCCUPATION: Production designer, theatre/television/film for [employer],    PLOF: Independent  PATIENT GOALS:  fish, work in yard, use dominant arm without issues.   Next MD visit: 04/13/2023  OBJECTIVE:   PATIENT SURVEYS:  FOTO intake: 56    predicted:  70 04/04/23: FOTO: update: 77%  COGNITION: Overall cognitive status: WFL     SENSATION: WFL except mild numbness at incision  POSTURE: Head forward, rounded shoulder  UPPER EXTREMITY ROM:   ROM Right eval Left eval Right 03/17/23 Right 03/24/23 Rt 04/04/23  Shoulder flexion Seated A: 94* Supine P: 103* WFL Supine A: 141* P: 142* Seated A: 119* P: 142* Seated A: 144 Supine:  A: 156    Shoulder extension       Shoulder abduction Seated scapation A: 61* Supine P: 73*  Supine A: 119* P: 120* Seated A: 87* Seated  A: 132 Supine:  A: 160  Shoulder adduction       Shoulder internal rotation Supine 45* abducted: P: 51*  Supine 45* abducted: A: 55* P: 57*    Shoulder external rotation Supine 45* abducted: P: 41*   Supine 45*  abducted: A: 61* P: 62*  Seated:  A: 45 Supine:  A: 70  Elbow flexion       Elbow extension       Wrist flexion       Wrist extension       Wrist ulnar deviation       Wrist radial deviation       Wrist pronation       Wrist supination       (Blank rows = not tested)  UPPER EXTREMITY MMT:  MMT Right eval  Shoulder flexion 3-/5  Shoulder extension   Shoulder abduction 3-/5  Shoulder adduction   Shoulder internal rotation   Shoulder external rotation 3-/5  Middle trapezius   Lower trapezius   Elbow flexion   Elbow extension   Wrist flexion   Wrist extension   Wrist ulnar deviation   Wrist radial deviation   Wrist pronation   Wrist supination   Grip strength (lbs)   (Blank rows = not tested)  PALPATION:  Eval on 03/14/2023: Tenderness right shoulder along incision, biceps tendon & muscle belly, Upper & middle trapezius, Levator Scapulae. Muscle atrophy noted in biceps, triceps, deltoid and scapular muscles.  TODAY'S TREATMENT:                                                                                                       DATE: 04/04/2023: Therapeutic Exercise: Pulley flexion & abduction 3 min each Supine: Measurements performed see chart above Supine: scapular stabilization: 1# circles x 30 each direction Standing: flexion: using 2# bar x 20  Standing: IR/ER: x 20 each c Yellow TB Prone: I's x 10 Prone: W's x 10  Side-lying ER using 2# 2 x 10   Manual Therapy PROM Right shoulder flexion, abduction & external rotation.  Gentle grade 2-3 GH inferior glides.       03/28/2023: Therapeutic Exercise: Pulley flexion & abduction 3 min with PT verbal & demo cues motion for abduction. PT progressed from scapation to abduction today.  Push-up off counter 10 reps Prone  scapular retraction 3 ways (hands over head shoulder flexion, elbows bent 90* horizontal abduction and arms at sides shoulder extension) with dowel rod for AAROM. 10 reps ea. Prone AROM external rotation & internal rotation 10 reps ea. Standing yellow Theraband external rotation with scapular retraction. Pt continues to have RUE scapular winging. PT added above exercises to HEP with HO, demo & verbal cues. Pt verbalized & return demo understanding.   Manual Therapy PROM Right shoulder flexion, abduction & external rotation.  Gentle inferior glides.    03/24/2023: Therapeutic Exercise: PT updated dowel exercises with standing options for flexion, abduction & external rotation. Pt performed 10 reps of each correctly.  PT verbal cues that he can do them either supine or standing but does not need to do both necessarily.  PT reviewed supine vs seated chin tuck & scapular retraction. Pt performed seated with cues for open chain using fingers on chin to facilitate proper motion. Pulley flexion & scapation 3 min with PT verbal & demo cues motion including following hand with eyes shoulder level and higher. Levator Scapulae stretch seated 20 sec hold 3 reps Reactive shoulder stabilization red theraband for ER, IR & flexion 10 reps 2 sets ea Standing elbow extension with palm facing forward, inward & backward for 10 sec hold 1 rep 2 sets of ea.  Working on minimizing elbow flexion with standing & gait.  Worked on gait with mirror focusing on not holding right elbow in flexed position compared to LUE. PT updated HEP with verbal cues on options of positions but not needed to do both. PT added standing version of dowel exercises, posture exercises as option.  PT verbal & tactile corrections on exercises.     03/22/2023: Therapeutic Exercise: Pulley flexion & scapation 3 min with PT verbal & demo cues motion including following hand with eyes shoulder level and higher. Levator Scapulae stretch seated 20 sec  hold 3 reps Reactive shoulder stabilization red theraband for ER, IR & flexion 10 reps 2 sets ea Standing scapular retraction 10 reps 2 sets PT added all above to HEP with demo, verbal, tactile & HO cues. Pt verbalized & return demo understanding.   Therapeutic Activities: Casting motion with simulated fishing rod  using mirror for monitoring shoulder motion / not elevating. 2 min ea forward cast which was more elbow motions and side cast motion.      PATIENT EDUCATION: Education details: HEP, POC Person educated: Patient Education method: Programmer, multimedia, Demonstration, Verbal cues, and Handouts Education comprehension: verbalized understanding, returned demonstration, and verbal cues required  HOME EXERCISE PROGRAM: Access Code: [email or web address] URL: [email or web address] Date: 03/28/2023 Prepared by: Vladimir Faster  Exercises - Supine Chin Tuck  - 1 x daily - 5 x weekly - 1 sets - 10 reps - 5 seconds hold - Seated Isometric Cervical Retraction with Chin Tucks with Pillow behind head  - 2-3 x daily - 7 x weekly - 2-3 sets - 10 reps - 5 seconds hold - Seated Scapular Retraction  - 2-3 x daily - 7 x weekly - 2-3 sets - 10 reps - 5 seconds hold - Supine Scapular Retraction  - 2-3 x daily - 7 x weekly - 2-3 sets - 5-10 reps - 5 seconds hold - Supine Shoulder Flexion Extension AAROM with Dowel  - 2-3 x daily - 7 x weekly - 2-3 sets - 5-10 reps - 5 seconds hold - Supine Shoulder Abduction AAROM with Dowel  - 2-3 x daily - 7 x weekly - 2-3 sets - 10 reps - 5 seconds hold - Supine Shoulder External Rotation in 45 Degrees Abduction AAROM with Dowel  - 2-3 x daily - 7 x weekly - 2-3 sets - 10 reps - 5 seconds hold - Supine Shoulder Press with Dowel  - 2-3 x daily - 7 x weekly - 2-3 sets - 10 reps - 5 seconds hold - Standing Shoulder Flexion AAROM with Dowel  - 1-3 x daily - 7 x weekly - 1 sets - 10-15 reps - 5 seconds hold - Standing Shoulder Abduction AAROM with Dowel  - 1-3 x daily - 7 x  weekly - 1 sets - 10-15 reps - 5 seconds hold - Standing Shoulder External Rotation AAROM with Dowel  - 1-3 x daily - 7 x weekly - 1 sets - 10-15 reps - 5 seconds hold - Seated Shoulder Flexion AAROM with Pulley Behind  - 2 x daily - 7 x weekly - 1 sets - 1 reps - 2 minutes hold - Seated Shoulder Scaption AAROM with Pulley at Side  - 2 x daily - 7 x weekly - 1 sets - 1 reps - 2 minutes hold - Standing Isometric Shoulder Flexion with Doorway - Arm Bent  - 1-2 x daily - 7 x weekly - 1-2 sets - 10 reps - 5 seconds hold - Standing Isometric Shoulder Internal Rotation at Doorway  - 1-2 x daily - 7 x weekly - 1-2 sets - 10 reps - 5 seconds hold - Standing Isometric Shoulder External Rotation with Doorway  - 1-2 x daily - 7 x weekly - 1-2 sets - 10 reps - 5 seconds hold - Standing Isometric Shoulder Abduction with Doorway - Arm Bent  - 1-2 x daily - 7 x weekly - 1-2 sets - 10 reps - 5 seconds hold - Standing Isometric Shoulder Extension with Doorway - Arm Bent  - 1-2 x daily - 7 x weekly - 1-2 sets - 10 reps - 5 seconds hold - Isometric Shoulder Adduction  - 1-2 x daily - 7 x weekly - 1-2 sets - 10 reps - 5 seconds hold - Seated Levator Scapulae Stretch  - 1-3 x daily - 7 x weekly - 1 sets - 3 reps -  20 seconds hold - Shoulder External Rotation Reactive Isometrics (Mirrored)  - 1 x daily - 7 x weekly - 2 sets - 10 reps - 5 hold - Shoulder Internal Rotation Reactive Isometrics  - 1 x daily - 7 x weekly - 2 sets - 10 reps - 5 seconds hold - Standing Shoulder Flexion Reactive Isometric (Mirrored)  - 1 x daily - 7 x weekly - 2 sets - 10 reps - 5 seconds hold - Standing Row with Anchored Resistance  - 1 x daily - 7 x weekly - 2 sets - 10 reps - 5 seconds hold - Standing Triceps Extension Single Arm   - 1 x daily - 7 x weekly - 1 sets - 1-2 reps - 10-15 seconds hold - Push-Up on Counter  - 1 x daily - 7 x weekly - 1-2 sets - 10 reps - 5 seconds hold - Prone Scapular Retraction in Flexion  - 1 x daily - 7 x  weekly - 1-2 sets - 10 reps - 5 seconds hold - Prone Scapular Retraction in Abduction  - 1 x daily - 7 x weekly - 1-2 sets - 10 reps - 5 seconds hold - Prone Shoulder Extension with Dowel  - 1 x daily - 7 x weekly - 1-2 sets - 10 reps - 5 seconds hold - Prone Shoulder External & Internal Rotation  - 1 x daily - 7 x weekly - 1-2 sets - 10 reps - 5 seconds hold - Shoulder External Rotation and Scapular Retraction with Resistance  - 1 x daily - 7 x weekly - 1-2 sets - 10 reps - 5 seconds hold  ASSESSMENT:  CLINICAL IMPRESSION: Pt tolerating with mild increase in soreness following ER exercises. Pt will continue with skilled PT 1x/ week to maximize his functional mobility.   OBJECTIVE IMPAIRMENTS: decreased activity tolerance, decreased endurance, decreased knowledge of condition, decreased ROM, decreased strength, increased edema, increased muscle spasms, impaired flexibility, impaired UE functional use, improper body mechanics, postural dysfunction, and pain.   ACTIVITY LIMITATIONS: carrying, lifting, bathing, reach over head, and fishing & yard work  PARTICIPATION LIMITATIONS: meal prep, occupation, yard work, and fishing  PERSONAL FACTORS: Time since onset of injury/illness/exacerbation and 1-2 comorbidities: see PMH  are also affecting patient's functional outcome.   REHAB POTENTIAL: Good  CLINICAL DECISION MAKING: Stable/uncomplicated  EVALUATION COMPLEXITY: Low   GOALS: Goals reviewed with patient? Yes  SHORT TERM GOALS: (target date for Short term goals are 4 weeks 04/08/2023)  1.Patient will demonstrate independent use of home exercise program to maintain progress from in clinic treatments. Goal status: MET 04/04/23  LONG TERM GOALS: (target dates for all long term goals are 8 weeks  05/06/2023 )   1. Patient will demonstrate/report pain at worst less than or equal to 2/10 to facilitate minimal limitation in daily activity secondary to pain symptoms. Goal status: MET  04/04/2023   2. Patient will demonstrate independent use of home exercise program to facilitate ability to maintain/progress functional gains from skilled physical therapy services. Goal status: Ongoing 04/04/2023   3. Patient will demonstrate FOTO outcome > or = 70 % to indicate reduced disability due to condition. Goal status: MET 04/04/23   4.  Patient will demonstrate right UE MMT >/= 4/5 throughout to facilitate lifting, reaching, carrying at [facility] in daily activity.   Goal status: Ongoing 03/28/2023   5.  Patient will demonstrate right GH joint AROM flex >100*, abd >90* and ER >60* without symptoms to facilitate usual  overhead reaching, self care, dressing at PLOF.    Goal status: Ongoing 03/28/2023   6.  patient reports able to use dominant right UE with fishing and yard work.   Goal status: Ongoing 04/04/2023    PLAN:  PT FREQUENCY: 1-2x/week  PT DURATION: 8 weeks  PLANNED INTERVENTIONS: Therapeutic exercises, Therapeutic activity, Neuro Muscular re-education, Balance training, Gait training, Patient/Family education, Joint mobilization, Stair training, DME instructions, Dry Needling, Electrical stimulation, Traction, Cryotherapy, vasopneumatic device Moist heat, Taping, Ultrasound, Ionotophoresis 4mg /ml Dexamethasone, and aquatic therapy ,Manual therapy.  All included unless contraindicated  PLAN FOR NEXT SESSION:  Check STG, do interim FOTO,   Reduce frequency to 1x/wk with PT updating HEP. Progress HEP as indicated.      Sharmon Leyden, PT, MPT 04/04/2023, 8:48 AM

## 2023-04-11 ENCOUNTER — Encounter: Payer: Self-pay | Admitting: Physical Therapy

## 2023-04-11 ENCOUNTER — Ambulatory Visit: Payer: BC Managed Care – PPO | Admitting: Physical Therapy

## 2023-04-11 DIAGNOSIS — R600 Localized edema: Secondary | ICD-10-CM

## 2023-04-11 DIAGNOSIS — M25511 Pain in right shoulder: Secondary | ICD-10-CM | POA: Diagnosis not present

## 2023-04-11 DIAGNOSIS — M6281 Muscle weakness (generalized): Secondary | ICD-10-CM | POA: Diagnosis not present

## 2023-04-11 DIAGNOSIS — M25611 Stiffness of right shoulder, not elsewhere classified: Secondary | ICD-10-CM

## 2023-04-13 ENCOUNTER — Ambulatory Visit (INDEPENDENT_AMBULATORY_CARE_PROVIDER_SITE_OTHER): Payer: BC Managed Care – PPO | Admitting: Orthopedic Surgery

## 2023-04-13 ENCOUNTER — Encounter: Payer: Self-pay | Admitting: Orthopedic Surgery

## 2023-04-13 DIAGNOSIS — M75101 Unspecified rotator cuff tear or rupture of right shoulder, not specified as traumatic: Secondary | ICD-10-CM

## 2023-04-13 NOTE — Progress Notes (Signed)
Post-Op Visit Note   Patient: Glen Rivera.           Date of Birth: 1961/04/08           MRN: [id number] Visit Date: 04/13/2023 PCP: Farris Has, MD   Assessment & Plan:  Chief Complaint:  Chief Complaint  Patient presents with   Right Shoulder - Follow-up    6 week follow up POST OP/R SHOULDER SCOPE (surgery date 01-17-23)   Visit Diagnoses:  1. Rotator cuff syndrome of right shoulder     Plan: Glen Rivera is a patient is now almost 3 months out right shoulder arthroscopy with rotator cuff tear repair.  Has 1 more visit in physical therapy.  He has been fishing.  He states his shoulder is better than it was before surgery.  He is able to sleep on that side.  On physical examination he has improving external rotation strength on the right and 5- out of 5 compared to the left at 5 out of 5.  Range of motion also generally symmetric between shoulders at 30/90/160.  No popping or crepitus with passive range of motion of that right shoulder.  Plan at this time is continue with activity as tolerated but I did caution him against heavy lifting out in front of his body.  I think he should have full freedom for activity after Christmas.  Right now he is definitely good for fishing and almost all other activities but I do want him to be careful during this next 3 months as to not reinjure the rotator cuff.  Follow-Up Instructions: No follow-ups on file.   Orders:  No orders of the defined types were placed in this encounter.  No orders of the defined types were placed in this encounter.   Imaging: No results found.  PMFS History: Patient Active Problem List   Diagnosis Date Noted   Obesity (BMI 30.0-34.9) 05/26/2020   Tendinitis of right quadriceps tendon 10/08/2019   Rotator cuff syndrome of right shoulder 06/05/2018   Lateral epicondylitis of right elbow 06/05/2018   Chest pain 09/16/2011   Obstructive sleep apnea 09/04/2009   Past Medical History:  Diagnosis Date   Anxiety     Hyperlipidemia    Obesity    OSA (obstructive sleep apnea) 2003    No family history on file.  No past surgical history on file. Social History   Occupational History   Not on file  Tobacco Use   Smoking status: Never   Smokeless tobacco: Never  Substance and Sexual Activity   Alcohol use: Yes    Comment: several beers a night   Drug use: No   Sexual activity: Yes

## 2023-04-18 ENCOUNTER — Encounter: Payer: BC Managed Care – PPO | Admitting: Physical Therapy
